# Patient Record
Sex: Female | Born: 1980 | Race: Black or African American | Hispanic: No | Marital: Married | Smoking: Never smoker
Health system: Southern US, Community
[De-identification: ages and names within clinical notes are randomized; demographics above are authoritative.]

## PROBLEM LIST (undated history)

## (undated) DIAGNOSIS — E039 Hypothyroidism, unspecified: Secondary | ICD-10-CM

## (undated) DIAGNOSIS — J45909 Unspecified asthma, uncomplicated: Secondary | ICD-10-CM

## (undated) DIAGNOSIS — Z8742 Personal history of other diseases of the female genital tract: Secondary | ICD-10-CM

## (undated) DIAGNOSIS — O09519 Supervision of elderly primigravida, unspecified trimester: Secondary | ICD-10-CM

## (undated) DIAGNOSIS — R51 Headache: Secondary | ICD-10-CM

## (undated) DIAGNOSIS — Z9889 Other specified postprocedural states: Secondary | ICD-10-CM

## (undated) DIAGNOSIS — R519 Headache, unspecified: Secondary | ICD-10-CM

## (undated) DIAGNOSIS — B009 Herpesviral infection, unspecified: Secondary | ICD-10-CM

## (undated) DIAGNOSIS — D649 Anemia, unspecified: Secondary | ICD-10-CM

## (undated) DIAGNOSIS — E063 Autoimmune thyroiditis: Secondary | ICD-10-CM

## (undated) DIAGNOSIS — R112 Nausea with vomiting, unspecified: Secondary | ICD-10-CM

## (undated) HISTORY — PX: ABDOMINAL HYSTERECTOMY: SHX81

## (undated) HISTORY — DX: Headache, unspecified: R51.9

## (undated) HISTORY — PX: WISDOM TOOTH EXTRACTION: SHX21

## (undated) HISTORY — DX: Anemia, unspecified: D64.9

## (undated) HISTORY — DX: Herpesviral infection, unspecified: B00.9

## (undated) HISTORY — DX: Autoimmune thyroiditis: E06.3

## (undated) HISTORY — DX: Personal history of other diseases of the female genital tract: Z87.42

## (undated) HISTORY — PX: DIAGNOSTIC LAPAROSCOPY: SUR761

## (undated) HISTORY — DX: Supervision of elderly primigravida, unspecified trimester: O09.519

## (undated) HISTORY — DX: Unspecified asthma, uncomplicated: J45.909

## (undated) HISTORY — PX: HERNIA REPAIR: SHX51

## (undated) HISTORY — DX: Hypothyroidism, unspecified: E03.9

## (undated) HISTORY — DX: Headache: R51

---

## 2001-04-08 ENCOUNTER — Other Ambulatory Visit: Admission: RE | Admit: 2001-04-08 | Discharge: 2001-04-08 | Payer: Self-pay | Admitting: Obstetrics and Gynecology

## 2002-06-09 ENCOUNTER — Encounter: Payer: Self-pay | Admitting: Emergency Medicine

## 2002-06-09 ENCOUNTER — Other Ambulatory Visit: Admission: RE | Admit: 2002-06-09 | Discharge: 2002-06-09 | Payer: Self-pay | Admitting: Obstetrics and Gynecology

## 2002-06-09 ENCOUNTER — Emergency Department (HOSPITAL_COMMUNITY): Admission: EM | Admit: 2002-06-09 | Discharge: 2002-06-09 | Payer: Self-pay | Admitting: Emergency Medicine

## 2002-10-18 ENCOUNTER — Other Ambulatory Visit: Admission: RE | Admit: 2002-10-18 | Discharge: 2002-10-18 | Payer: Self-pay | Admitting: Obstetrics and Gynecology

## 2002-12-09 HISTORY — PX: FINGER FRACTURE SURGERY: SHX638

## 2003-03-11 ENCOUNTER — Encounter: Payer: Self-pay | Admitting: Emergency Medicine

## 2003-03-11 ENCOUNTER — Emergency Department (HOSPITAL_COMMUNITY): Admission: EM | Admit: 2003-03-11 | Discharge: 2003-03-11 | Payer: Self-pay | Admitting: Emergency Medicine

## 2003-03-19 ENCOUNTER — Ambulatory Visit (HOSPITAL_COMMUNITY): Admission: RE | Admit: 2003-03-19 | Discharge: 2003-03-19 | Payer: Self-pay | Admitting: Orthopedic Surgery

## 2003-05-25 ENCOUNTER — Emergency Department (HOSPITAL_COMMUNITY): Admission: EM | Admit: 2003-05-25 | Discharge: 2003-05-25 | Payer: Self-pay | Admitting: Emergency Medicine

## 2003-06-01 ENCOUNTER — Emergency Department (HOSPITAL_COMMUNITY): Admission: EM | Admit: 2003-06-01 | Discharge: 2003-06-01 | Payer: Self-pay | Admitting: Emergency Medicine

## 2004-01-19 ENCOUNTER — Other Ambulatory Visit: Admission: RE | Admit: 2004-01-19 | Discharge: 2004-01-19 | Payer: Self-pay | Admitting: Obstetrics & Gynecology

## 2004-04-27 ENCOUNTER — Emergency Department (HOSPITAL_COMMUNITY): Admission: EM | Admit: 2004-04-27 | Discharge: 2004-04-28 | Payer: Self-pay | Admitting: Emergency Medicine

## 2004-08-06 ENCOUNTER — Emergency Department (HOSPITAL_COMMUNITY): Admission: EM | Admit: 2004-08-06 | Discharge: 2004-08-07 | Payer: Self-pay | Admitting: Emergency Medicine

## 2004-08-09 ENCOUNTER — Emergency Department (HOSPITAL_COMMUNITY): Admission: EM | Admit: 2004-08-09 | Discharge: 2004-08-09 | Payer: Self-pay | Admitting: Emergency Medicine

## 2005-11-27 ENCOUNTER — Emergency Department (HOSPITAL_COMMUNITY): Admission: EM | Admit: 2005-11-27 | Discharge: 2005-11-27 | Payer: Self-pay | Admitting: Emergency Medicine

## 2006-12-31 ENCOUNTER — Other Ambulatory Visit: Admission: RE | Admit: 2006-12-31 | Discharge: 2006-12-31 | Payer: Self-pay | Admitting: Internal Medicine

## 2007-01-01 ENCOUNTER — Encounter: Admission: RE | Admit: 2007-01-01 | Discharge: 2007-01-01 | Payer: Self-pay | Admitting: Internal Medicine

## 2007-12-10 HISTORY — PX: ENDOMETRIAL ABLATION: SHX621

## 2008-01-01 ENCOUNTER — Encounter: Payer: Self-pay | Admitting: Gynecology

## 2008-01-01 ENCOUNTER — Ambulatory Visit (HOSPITAL_BASED_OUTPATIENT_CLINIC_OR_DEPARTMENT_OTHER): Admission: RE | Admit: 2008-01-01 | Discharge: 2008-01-01 | Payer: Self-pay | Admitting: Gynecology

## 2008-11-15 ENCOUNTER — Ambulatory Visit (HOSPITAL_COMMUNITY): Admission: RE | Admit: 2008-11-15 | Discharge: 2008-11-16 | Payer: Self-pay | Admitting: Obstetrics & Gynecology

## 2008-12-07 ENCOUNTER — Encounter (INDEPENDENT_AMBULATORY_CARE_PROVIDER_SITE_OTHER): Payer: Self-pay | Admitting: *Deleted

## 2008-12-07 ENCOUNTER — Inpatient Hospital Stay (HOSPITAL_COMMUNITY): Admission: AD | Admit: 2008-12-07 | Discharge: 2008-12-07 | Payer: Self-pay | Admitting: Obstetrics and Gynecology

## 2009-11-09 ENCOUNTER — Emergency Department (HOSPITAL_COMMUNITY): Admission: EM | Admit: 2009-11-09 | Discharge: 2009-11-09 | Payer: Self-pay | Admitting: Emergency Medicine

## 2010-03-05 ENCOUNTER — Encounter (INDEPENDENT_AMBULATORY_CARE_PROVIDER_SITE_OTHER): Payer: Self-pay | Admitting: *Deleted

## 2010-03-09 ENCOUNTER — Encounter (INDEPENDENT_AMBULATORY_CARE_PROVIDER_SITE_OTHER): Payer: Self-pay | Admitting: *Deleted

## 2010-03-09 ENCOUNTER — Ambulatory Visit: Payer: Self-pay | Admitting: Gastroenterology

## 2010-03-09 DIAGNOSIS — K5900 Constipation, unspecified: Secondary | ICD-10-CM | POA: Insufficient documentation

## 2010-03-09 DIAGNOSIS — R195 Other fecal abnormalities: Secondary | ICD-10-CM

## 2010-03-09 DIAGNOSIS — J45909 Unspecified asthma, uncomplicated: Secondary | ICD-10-CM | POA: Insufficient documentation

## 2010-03-09 DIAGNOSIS — N809 Endometriosis, unspecified: Secondary | ICD-10-CM | POA: Insufficient documentation

## 2010-03-09 DIAGNOSIS — K625 Hemorrhage of anus and rectum: Secondary | ICD-10-CM

## 2010-03-09 DIAGNOSIS — Z888 Allergy status to other drugs, medicaments and biological substances status: Secondary | ICD-10-CM

## 2010-03-09 LAB — CONVERTED CEMR LAB
ALT: 16 units/L (ref 0–35)
Albumin: 3.6 g/dL (ref 3.5–5.2)
Bilirubin, Direct: 0.1 mg/dL (ref 0.0–0.3)
CO2: 28 meq/L (ref 19–32)
Calcium: 8.6 mg/dL (ref 8.4–10.5)
Chloride: 110 meq/L (ref 96–112)
Creatinine, Ser: 0.7 mg/dL (ref 0.4–1.2)
Ferritin: 31.5 ng/mL (ref 10.0–291.0)
Glucose, Bld: 79 mg/dL (ref 70–99)
Saturation Ratios: 4.3 % — ABNORMAL LOW (ref 20.0–50.0)
Total Protein: 7.2 g/dL (ref 6.0–8.3)
Transferrin: 216 mg/dL (ref 212.0–360.0)
Vitamin B-12: 295 pg/mL (ref 211–911)

## 2010-03-12 ENCOUNTER — Ambulatory Visit: Payer: Self-pay | Admitting: Gastroenterology

## 2010-03-12 DIAGNOSIS — K2990 Gastroduodenitis, unspecified, without bleeding: Secondary | ICD-10-CM

## 2010-03-12 DIAGNOSIS — K2970 Gastritis, unspecified, without bleeding: Secondary | ICD-10-CM | POA: Insufficient documentation

## 2010-03-13 ENCOUNTER — Telehealth: Payer: Self-pay | Admitting: Gastroenterology

## 2010-03-15 ENCOUNTER — Ambulatory Visit (HOSPITAL_COMMUNITY): Admission: RE | Admit: 2010-03-15 | Discharge: 2010-03-15 | Payer: Self-pay | Admitting: Obstetrics & Gynecology

## 2010-03-19 ENCOUNTER — Encounter: Payer: Self-pay | Admitting: Gastroenterology

## 2010-03-20 ENCOUNTER — Encounter: Payer: Self-pay | Admitting: Gastroenterology

## 2010-03-27 ENCOUNTER — Ambulatory Visit: Payer: Self-pay | Admitting: Gastroenterology

## 2010-03-27 ENCOUNTER — Telehealth: Payer: Self-pay | Admitting: Gastroenterology

## 2010-03-27 DIAGNOSIS — K5909 Other constipation: Secondary | ICD-10-CM | POA: Insufficient documentation

## 2010-04-09 ENCOUNTER — Telehealth: Payer: Self-pay | Admitting: Gastroenterology

## 2010-04-10 ENCOUNTER — Ambulatory Visit: Payer: Self-pay | Admitting: Gastroenterology

## 2010-11-20 ENCOUNTER — Telehealth: Payer: Self-pay | Admitting: Gastroenterology

## 2010-12-09 HISTORY — PX: COLONOSCOPY: SHX5424

## 2011-01-06 LAB — CONVERTED CEMR LAB
CRP, High Sensitivity: 0 (ref 0.00–5.00)
Sed Rate: 15 mm/hr (ref 0–22)

## 2011-01-08 NOTE — Letter (Signed)
Summary: Patient Notice- Colon Biospy Results  [facility]  [address] [place], [place] [address]   Phone: [phone]  Fax: [phone]        March 19, 2010 MRN: [id number]    Heather Pena [address]  [address] [place], [place]  [address]    Dear Ms. Postel,  I am pleased to inform you that the biopsies taken during your recent colonoscopy did not show any evidence of cancer upon pathologic examination.  Additional information/recommendations:  __No further action is needed at this time.  Please follow-up with      your primary care physician for your other healthcare needs.  __Please call [phone] to schedule a return visit to review      your condition.  _XX_Continue with the treatment plan as outlined on the day of your      exam.  __You should have a repeat colonoscopy examination for this problem           in _ years.  Please call us if you are having persistent problems or have questions about your condition that have not been fully answered at this time.  Sincerely,  Mardella Layman MD [facility]   This letter has been electronically signed by your physician.  Appended Document: Patient Notice- Colon Biospy Results letter mailed 04.13.11

## 2011-01-08 NOTE — Letter (Signed)
Summary: [facility] Instructions  [facility]  [address] [place], [place] [address]   Phone: [phone]  Fax: [phone]       [address]    [birth year]    MRN: [id number]        Procedure Day /Date: Monday, 03/12/10     Arrival Time: 8:00     Procedure Time: 9:00     Location of Procedure:                    _X _  [facility] (4th Floor)   PREPARATION FOR COLONOSCOPY WITH MOVIPREP   Starting today do not eat nuts, seeds, popcorn, corn, beans, peas,  salads, or any raw vegetables.  Do not take any fiber supplements (e.g. Metamucil, Citrucel, and Benefiber).  THE DAY BEFORE YOUR PROCEDURE         DATE: 03/11/10   DAY: Sunday  1.  Drink clear liquids the entire day-NO SOLID FOOD  2.  Do not drink anything colored red or purple.  Avoid juices with pulp.  No orange juice.  3.  Drink at least 64 oz. (8 glasses) of fluid/clear liquids during the day to prevent dehydration and help the prep work efficiently.  CLEAR LIQUIDS INCLUDE: Water Jello Ice Popsicles Tea (sugar ok, no milk/cream) Powdered fruit flavored drinks Coffee (sugar ok, no milk/cream) Gatorade Juice: apple, white grape, white cranberry  Lemonade Clear bullion, consomm, broth Carbonated beverages (any kind) Strained chicken noodle soup Hard Candy                             4.  In the morning, mix first dose of MoviPrep solution:    Empty 1 Pouch A and 1 Pouch B into the disposable container    Add lukewarm drinking water to the top line of the container. Mix to dissolve    Refrigerate (mixed solution should be used within 24 hrs)  5.  Begin drinking the prep at 5:00 p.m. The MoviPrep container is divided by 4 marks.   Every 15 minutes drink the solution down to the next mark (approximately 8 oz) until the full liter is complete.   6.  Follow completed prep with 16 oz of clear liquid of your choice (Nothing red or purple).  Continue to drink clear liquids until  bedtime.  7.  Before going to bed, mix second dose of MoviPrep solution:    Empty 1 Pouch A and 1 Pouch B into the disposable container    Add lukewarm drinking water to the top line of the container. Mix to dissolve    Refrigerate  THE DAY OF YOUR PROCEDURE      DATE: 03/12/10  DAY:  Monday  Beginning at 4:00 a.m. (5 hours before procedure):         1. Every 15 minutes, drink the solution down to the next mark (approx 8 oz) until the full liter is complete.  2. Follow completed prep with 16 oz. of clear liquid of your choice.    3. You may drink clear liquids until 7:00  (2 HOURS BEFORE PROCEDURE).   MEDICATION INSTRUCTIONS  Unless otherwise instructed, you should take regular prescription medications with a small sip of water   as early as possible the morning of your procedure.                  OTHER INSTRUCTIONS  You will need a  responsible adult at least 29 years of age to accompany you and drive you home.   This person must remain in the waiting room during your procedure.  Wear loose fitting clothing that is easily removed.  Leave jewelry and other valuables at home.  However, you may wish to bring a book to read or  an iPod/MP3 player to listen to music as you wait for your procedure to start.  Remove all body piercing jewelry and leave at home.  Total time from sign-in until discharge is approximately 2-3 hours.  You should go home directly after your procedure and rest.  You can resume normal activities the  day after your procedure.  The day of your procedure you should not:   Drive   Make legal decisions   Operate machinery   Drink alcohol   Return to work  You will receive specific instructions about eating, activities and medications before you leave.    The above instructions have been reviewed and explained to me by   _______________________    I fully understand and can verbalize these instructions _____________________________  Date _________

## 2011-01-08 NOTE — Miscellaneous (Signed)
Summary: clotest  Clinical Lists Changes  Problems: Added new problem of GASTRITIS (ICD-535.50) Orders: Added new Test order of TLB-H Pylori Screen Gastric Biopsy (83013-CLOTEST) - Signed 

## 2011-01-08 NOTE — Letter (Signed)
Summary: New Patient letter  [facility]  [address] [place], [place] [address]   Phone: [phone]  Fax: [phone]       03/05/2010 MRN: [id number]  Heather Pena [address] [place], [place]  [address]  Dear Heather Pena,  Welcome to the Gastroenterology Division at [facility].    You are scheduled to see Dr. Jarold Motto on 03-09-10 at 9:30a.m. on the 3rd floor at [facility], [address]  We ask that you try to arrive at our office 15 minutes prior to your appointment time to allow for check-in.  We would like you to complete the enclosed self-administered evaluation form prior to your visit and bring it with you on the day of your appointment.  We will review it with you.  Also, please bring a complete list of all your medications or, if you prefer, bring the medication bottles and we will list them.  Please bring your insurance card so that we may make a copy of it.  If your insurance requires a referral to see a specialist, please bring your referral form from your primary care physician.  Co-payments are due at the time of your visit and may be paid by cash, check or credit card.     Your office visit will consist of a consult with your physician (includes a physical exam), any laboratory testing he/she may order, scheduling of any necessary diagnostic testing (e.g. x-ray, ultrasound, CT-scan), and scheduling of a procedure (e.g. Endoscopy, Colonoscopy) if required.  Please allow enough time on your schedule to allow for any/all of these possibilities.    If you cannot keep your appointment, please call [phone] to cancel or reschedule prior to your appointment date.  This allows [place] the opportunity to schedule an appointment for another patient in need of care.  If you do not cancel or reschedule by 5 p.m. the business day prior to your appointment date, you will be charged a $50.00 late cancellation/no-show fee.    Thank you for  choosing [facility] for your medical needs.  We appreciate the opportunity to care for you.  Please visit [place] at our website  to learn more about our practice.                     Sincerely,                                                             The Gastroenterology Division

## 2011-01-08 NOTE — Procedures (Signed)
Summary: Upper Endoscopy  Patient: [name] Note: All result statuses are Final unless otherwise noted.  Tests: (1) Upper Endoscopy (EGD)   EGD Upper Endoscopy       DONE     [facility]     [address]     [place], [place]  [address]           ENDOSCOPY PROCEDURE REPORT           PATIENT:  Heather Pena, Heather Pena  MR#:  [id number]     BIRTHDATE:  [birth year], 29 yrs. old  GENDER:  female           ENDOSCOPIST:  Vania Rea. Jarold Motto, MD, [facility]     Referred by:           PROCEDURE DATE:  03/12/2010     PROCEDURE:  EGD with biopsy     ASA CLASS:  Class II     INDICATIONS:  abdominal pain, multiple sites, diarrhea, FOBT +     stool           MEDICATIONS:   There was residual sedation effect present from     prior procedure., Fentanyl 25 mcg IV, Versed 3 mg IV     TOPICAL ANESTHETIC:  Exactacain Spray           DESCRIPTION OF PROCEDURE:   After the risks benefits and     alternatives of the procedure were thoroughly explained, informed     consent was obtained.  The LB GIF-H180 [id number] endoscope was     introduced through the mouth and advanced to the second portion of     the duodenum, without limitations.  The instrument was slowly     withdrawn as the mucosa was fully examined.     <<PROCEDUREIMAGES>>           Mild gastritis was found in the antrum. CLO AND REGULAR BIOPSIES     DONE.    Retroflexed views revealed no abnormalities.    The scope     was then withdrawn from the patient and the procedure completed.           COMPLICATIONS:  None           ENDOSCOPIC IMPRESSION:     1) Mild gastritis in the antrum     ?? NSAID DAMAGE.R/O H.PYLORI.     RECOMMENDATIONS:     1) Avoid NSAIDS for two weeks     2) Await biopsy results           REPEAT EXAM:  No           ______________________________     Vania Rea. Jarold Motto, MD, [facility]           CC:           n.     eSIGNED:   Vania Rea. Patterson at 03/12/2010 09:44 AM           [name],  [id number]  Note: An exclamation mark (!) indicates a result that was not dispersed into the flowsheet. Document Creation Date: 03/12/2010 9:45 AM _______________________________________________________________________  (1) Order result status: Final Collection or observation date-time: 03/12/2010 09:35 Requested date-time:  Receipt date-time:  Reported date-time:  Referring Physician:   Ordering Physician: Sheryn Bison [phone]) Specimen Source:  Source: Launa Grill Order Number: [id number] Lab site:

## 2011-01-08 NOTE — Procedures (Signed)
Summary: Colonoscopy  Patient: Heather Pena Note: All result statuses are Final unless otherwise noted.  Tests: (1) Colonoscopy (COL)   COL Colonoscopy           DONE     [facility]     [address]     [place], [place]  [address]           COLONOSCOPY PROCEDURE REPORT           PATIENT:  [name], [name]  MR#:  [id number]     BIRTHDATE:  12-27-1980, 29 yrs. old  GENDER:  female     ENDOSCOPIST:  Vania Rea. Jarold Motto, MD, [facility]     REF. BY:     PROCEDURE DATE:  03/12/2010     PROCEDURE:  Colonoscopy with biopsy     ASA CLASS:  Class II     INDICATIONS:  FOBT positive stool, abdominal pain, unexplained     diarrhea     MEDICATIONS:   Fentanyl 75 mcg IV, Versed 7 mg IV           DESCRIPTION OF PROCEDURE:   After the risks benefits and     alternatives of the procedure were thoroughly explained, informed     consent was obtained.  Digital rectal exam was performed and     revealed no abnormalities.   The LB PCF-H180AL [id number] endoscope     was introduced through the anus and advanced to the terminal ileum     which was intubated for a short distance, without limitations.     The quality of the prep was excellent, using MoviPrep.  The     instrument was then slowly withdrawn as the colon was fully     examined.     <<PROCEDUREIMAGES>>           FINDINGS:  There were inflammatory changes in the ileum. granular     and nodular ileum biopsied.  Colitis was found in the sigmoid     colon. grauular sigmiod with erosions biopsied.  No polyps or     cancers were seen.   Retroflexed views in the rectum revealed no     abnormalities.    The scope was then withdrawn from the patient     and the procedure completed.           COMPLICATIONS:  None     ENDOSCOPIC IMPRESSION:     1) Ileitis     2) Colitis in the sigmoid colon     3) No polyps or cancers     possible CROHN'S DISEASE.     RECOMMENDATIONS:     1) Avoid all NSAIDS for the next 2 weeks.     2) Await biopsy  results     3) call office next 1-3 days to schedule followup visit in     4) Upper endoscopy will be scheduled     ENTOCORT 3MG . #100.3 PO QAM.     REPEAT EXAM:  No           ______________________________     Vania Rea. Jarold Motto, MD, [facility]           CC:           n.     eSIGNED:   Vania Rea. Tyrese Ficek at 03/12/2010 09:30 AM           [name], [id number]  Note: An exclamation mark (!) indicates a result that was not dispersed into the flowsheet. Document  Creation Date: 03/12/2010 9:31 AM _______________________________________________________________________  (1) Order result status: Final Collection or observation date-time: 03/12/2010 09:22 Requested date-time:  Receipt date-time:  Reported date-time:  Referring Physician:   Ordering Physician: Sheryn Bison [phone]) Specimen Source:  Source: Launa Grill Order Number: [id number] Lab site:   Appended Document: Colonoscopy NOTED

## 2011-01-08 NOTE — Miscellaneous (Signed)
Summary: entocort prescription  Clinical Lists Changes  Medications: Added new medication of ENTOCORT EC 3 MG  CP24 (BUDESONIDE) Take 3 capsules daily in am - Signed Rx of ENTOCORT EC 3 MG  CP24 (BUDESONIDE) Take 3 capsules daily in am;  #100 x 3;  Signed;  Entered by: Laverna Peace RN;  Authorized by: Mardella Layman MD [facility];  Method used: Electronically to [facility] # [id number]*, [address] [place], [place]  [address], Ph: [phone], Fax: [phone]    Prescriptions: ENTOCORT EC 3 MG  CP24 (BUDESONIDE) Take 3 capsules daily in am  #100 x 3   Entered by:   Laverna Peace RN   Authorized by:   Mardella Layman MD [facility]   Signed by:   Laverna Peace RN on 03/12/2010   Method used:   Electronically to        [facility] # [id number]* (retail)       [address]       [place], [place]  [address]       Ph: [phone]       Fax: [phone]   RxID:   [id number]

## 2011-01-08 NOTE — Letter (Signed)
Summary: Patient [facility] Biopsy Results  [facility]  [address] [place], [place] [address]   Phone: [phone]  Fax: [phone]        March 20, 2010 MRN: [id number]    Heather Pena [address]  [address] [place], [place]  [address]    Dear Ms. Haddox,  I am pleased to inform you that the biopsies taken during your recent endoscopic examination did not show any evidence of cancer upon pathologic examination.  Additional information/recommendations:  __No further action is needed at this time.  Please follow-up with      your primary care physician for your other healthcare needs.  __ Please call [phone] to schedule a return visit to review      your condition.  _x_ Continue with the treatment plan as outlined on the day of your      exam.  __ You should have a repeat endoscopic examination for this problem              in _ months/years.   Please call us if you are having persistent problems or have questions about your condition that have not been fully answered at this time.  Sincerely,  Mardella Layman MD [facility]  This letter has been electronically signed by your physician.  Appended Document: Patient Notice-Endo Biopsy Results letter mailed 04.13.11

## 2011-01-08 NOTE — Progress Notes (Signed)
Summary: Triage  Phone Note Call from Patient Call back at Home Phone [phone]   Caller: Patient Call For: Dr. Jarold Motto Reason for Call: Talk to Nurse Summary of Call: Entocort is too expensive. Wants to know if there is a alternative med Initial call taken by: Karna Christmas,  March 13, 2010 11:21 AM  Follow-up for Phone Call        Savings card given to pt.  She will call back if med still too expensive. Follow-up by: Ashok Cordia RN,  March 13, 2010 12:23 PM

## 2011-01-08 NOTE — Assessment & Plan Note (Signed)
Summary: F/U APPT...LSW.   History of Present Illness Visit Type: Follow-up Visit Primary GI MD: Sheryn Bison MD FACP FAGA Chief Complaint: Patient still having some left sided abdominal pain that is always there, but gets sharp at times. She did have some a small trace of blood in her stool last week but none since, she is having some constipation. She took Miralax as a 7 day treatment and the stopped, and she also states that she was told by Lupita Leash to stop the Entocort on April 20th. She states that her stool have alot of mucous in them still.  History of Present Illness:   Continued continued constipation without significant rectal bleeding or abdominal pain. She is a good response to MiraLax prescription. Labs/ inflammatory bowel disease serologies were all negative. She has stopped her Entocort therapy.   GI Review of Systems    Reports abdominal pain.     Location of  Abdominal pain: left side.    Denies acid reflux, belching, bloating, chest pain, dysphagia with liquids, dysphagia with solids, heartburn, loss of appetite, nausea, vomiting, vomiting blood, weight loss, and  weight gain.      Reports constipation and  rectal bleeding.     Denies anal fissure, black tarry stools, change in bowel habit, diarrhea, diverticulosis, fecal incontinence, heme positive stool, hemorrhoids, irritable bowel syndrome, jaundice, light color stool, liver problems, and  rectal pain.    Current Medications (verified): 1)  Albuterol Sulfate (2.5 Mg/67ml) 0.083% Nebu (Albuterol Sulfate) .... As Needed Once or Twice A Month 2)  Tramadol Hcl 50 Mg Tabs (Tramadol Hcl) .Marland Kitchen.. 1 By Mouth Q 6-8 Hrs As Needed Pain 3)  Dexilant 60 Mg Cpdr (Dexlansoprazole) .Marland Kitchen.. 1 By Mouth Qd 4)  Tandem 162-115.2 Mg Caps (Ferrous Fum-Iron Polysacch) .Marland Kitchen.. 1 By Mouth Once Daily 5)  Colace 100 Mg  Caps (Docusate Sodium) .... Two Times A Day  Allergies (verified): 1)  ! Sulfa  Past History:  Past medical, surgical, family and  social histories (including risk factors) reviewed for relevance to current acute and chronic problems.  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 Laparoscopy x 2 Finger surgery-left hand Wisdom teeth extraction  Family History: Reviewed history from 03/09/2010 and no changes required. Brain tumor: Uncle Family History of Diabetes: Maternal Grandmother No FH of Colon Cancer:  Social History: Reviewed history from 03/09/2010 and no changes required. 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  Alcohol Use - yes occasionally Daily Caffeine Use Illicit Drug Use - no Patient gets regular exercise.  Review of Systems       The patient complains of menstrual pain.  The patient denies allergy/sinus, anemia, anxiety-new, arthritis/joint pain, back pain, blood in urine, breast changes/lumps, change in vision, confusion, cough, coughing up blood, depression-new, fainting, fatigue, fever, headaches-new, hearing problems, heart murmur, heart rhythm changes, itching, muscle pains/cramps, night sweats, nosebleeds, pregnancy symptoms, shortness of breath, skin rash, sleeping problems, sore throat, swelling of feet/legs, swollen lymph glands, thirst - excessive , urination - excessive , urination changes/pain, urine leakage, vision changes, and voice change.    Vital Signs:  Patient profile:   30 year old female Height:      72  inches Weight:      115.0 pounds BMI:     20.44 Pulse  rate:   74 / minute Pulse rhythm:   regular BP sitting:   100 / 62  (left arm) Cuff size:   regular  Vitals Entered By: Harlow Mares CMA Duncan Dull) (Apr 10, 2010 10:57 AM)  Physical Exam  General:  Well developed, well nourished, no acute distress.healthy appearing.   Head:  Normocephalic and atraumatic. Eyes:  PERRLA, no icterus.exam deferred to patient's ophthalmologist.   Abdomen:  Soft, nontender and nondistended. No masses, hepatosplenomegaly or hernias noted. Normal bowel sounds. Psych:  Alert and cooperative. Normal mood and affect.   Impression & Recommendations:  Problem # 1:  OTHER CONSTIPATION (ICD-564.09) Assessment Improved Continue p.o. fiber as tolerated liberal p.o. fluids and nightly MiraLax for 8 ounces or regular basis. P.r.n. GI followup as needed. It appears that her previous cut irritation was from NSAID use associate with heavy menstrual periods. She is to stay off NSAIDs and use p.r.n. tramadol 50 mg every 6-8 hours as needed.  Problem # 2:  ENDOMETRIOSIS, SITE UNSPECIFIED (ICD-617.9) Assessment: Unchanged  Patient Instructions: 1)  Stop Enotcort. 2)  Stop Dexilant. 3)  Begin Miralax each night. 4)  The medication list was reviewed and reconciled.  All changed / newly prescribed medications were explained.  A complete medication list was provided to the patient / caregiver. 5)  Please schedule a follow-up appointment as needed.  6)  Constipation and Hemorrhoids brochure given.   Appended Document: F/U APPT...LSW.    Clinical Lists Changes  Medications: Removed medication of DEXILANT 60 MG CPDR (DEXLANSOPRAZOLE) 1 by mouth qd Added new medication of MIRALAX   POWD (POLYETHYLENE GLYCOL 3350) q hs

## 2011-01-08 NOTE — Progress Notes (Signed)
  Phone Note Outgoing Call   Call placed by: Ok Anis CMA,  Apr 09, 2010 11:37 AM Call placed to: Insurer Summary of Call: Received from [facility] Prior Authorization form for patients Dexilant. Filled form out and faxed back today. Waiting on response

## 2011-01-08 NOTE — Assessment & Plan Note (Signed)
Summary: f/u from procedure--ch.   History of Present Illness Visit Type: Follow-up Visit Primary GI MD: Sheryn Bison MD FACP FAGA Chief Complaint: Patient here for f/u after endo/colon....patient states that she is still experiencing abdominal discomfort/sharp pains as well as some very occasional loose stool. She denies any brbpr. She has had some nausea and vomiting. History of Present Illness:   She still has abdominal gas, bloating, cramping, and severe constipation. Colonoscopy showed mild ileitis and colitis and biopsies now are reviewed and most likely are secondary to prior heavy NSAID use associated with heavy menstrual periods and her endometriosis. She was taking as much as 800 mg of ibuprofen 3-4 times a day. She continues to have regular gynecologic followup. Labs were all unremarkable. She still may do a week between bowel movements but has no nausea and vomiting rather systemic complaints.   GI Review of Systems    Reports abdominal pain, belching, loss of appetite, nausea, and  vomiting.     Location of  Abdominal pain: generalized.    Denies acid reflux, bloating, chest pain, dysphagia with liquids, dysphagia with solids, heartburn, vomiting blood, weight loss, and  weight gain.      Reports change in bowel habits.     Denies anal fissure, black tarry stools, constipation, diarrhea, diverticulosis, fecal incontinence, heme positive stool, hemorrhoids, irritable bowel syndrome, jaundice, light color stool, liver problems, rectal bleeding, and  rectal pain.    Current Medications (verified): 1)  Albuterol Sulfate (2.5 Mg/82ml) 0.083% Nebu (Albuterol Sulfate) .... As Needed Once or Twice A Month 2)  Moviprep 100 Gm  Solr (Peg-Kcl-Nacl-Nasulf-Na Asc-C) .... As Per Prep Instructions. 3)  Tramadol Hcl 50 Mg Tabs (Tramadol Hcl) .Marland Kitchen.. 1 By Mouth Q 6-8 Hrs As Needed Pain 4)  Dexilant 60 Mg Cpdr (Dexlansoprazole) .Marland Kitchen.. 1 By Mouth Qd 5)  Tandem 162-115.2 Mg Caps (Ferrous Fum-Iron  Polysacch) .Marland Kitchen.. 1 By Mouth Once Daily 6)  Entocort Ec 3 Mg  Cp24 (Budesonide) .... Take 3 Capsules Daily in Am  Allergies: 1)  ! Sulfa  Past History:  Past medical, surgical, family and social histories (including risk factors) reviewed for relevance to current acute and chronic problems.  Past Medical History: Reviewed history from 03/08/2010 and no changes required. Asthma Endometriosis  Past Surgical History: Laparoscopy x 2 Finger surgery-left hand Wisdom teeth extraction  Family History: Reviewed history from 03/09/2010 and no changes required. Brain tumor: Uncle Family History of Diabetes: Maternal Grandmother No FH of Colon Cancer:  Social History: Reviewed history from 03/09/2010 and no changes required. 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  Alcohol Use - yes occasionally Daily Caffeine Use Illicit Drug Use - no Patient gets regular exercise.  Review of Systems       The patient complains of menstrual pain.  The patient denies allergy/sinus, anemia, anxiety-new, arthritis/joint pain, back pain, blood in urine, breast changes/lumps, change in vision, confusion, cough, coughing up blood, depression-new, fainting, fatigue, fever, headaches-new, hearing problems, heart murmur, heart rhythm changes, itching, muscle pains/cramps, night sweats, nosebleeds, pregnancy symptoms, shortness of breath, skin rash, sleeping problems, sore throat, swelling of feet/legs, swollen lymph glands, thirst - excessive , urination - excessive , urination changes/pain, urine leakage, vision changes, and voice change.    Vital Signs:  Patient profile:   30 year old female Height:      63 inches Weight:      114.50 pounds BMI:     20.36 BSA:     1.53 Pulse rate:  72 / minute Pulse rhythm:   regular BP sitting:   110 / 72  (left arm)  Vitals Entered By: Hortense Ramal CMA Duncan Dull) (March 27, 2010 9:51 AM)  Physical Exam  General:  Well developed, well nourished,  no acute distress.healthy appearing.  healthy appearing.   Head:  Normocephalic and atraumatic. Eyes:  PERRLA, no icterus.exam deferred to patient's ophthalmologist.  exam deferred to patient's ophthalmologist.   Abdomen:  Soft, nontender and nondistended. No masses, hepatosplenomegaly or hernias noted. Normal bowel sounds.Her abdomen is nondistended and nontender. I cannot appreciate any bowel sounds. Extremities:  No clubbing, cyanosis, edema or deformities noted. Neurologic:  Alert and  oriented x4;  grossly normal neurologically. Psych:  Alert and cooperative. Normal mood and affect.   Impression & Recommendations:  Problem # 1:  ILEITIS (ICD-558.9) Assessment Unchanged Probable NSAID damage. I doubt Crohn's disease but we'll check IBD serology from [facility] lab, CRP and sed rate. I decreased her Entocort to 6 mg a day for week, then 3 mg a day for a week, and return to clinic for visit in 2 weeks.  Problem # 2:  OTHER CONSTIPATION (ICD-564.09) Assessment: Unchanged MiraLax 8 ounces regularly at bedtime with Colace 100 mg twice a day. Orders: TLB-CRP-High Sensitivity (C-Reactive Protein) (86140-FCRP) TLB-Sedimentation Rate (ESR) (85652-ESR)  Problem # 3:  ENDOMETRIOSIS, SITE UNSPECIFIED (ICD-617.9) Assessment: Unchanged Continue contact logic followup as scheduled with possible repeat laparoscopy. Patient was iron deficient is currently on tandem therapy.  Problem # 4:  GASTRITIS (ICD-535.50) Assessment: Improved NSAID damage and gastritis with negative evaluation for Helicobacter. We'll finish a course of Zegerid therapy. Again, she is to avoid NSAIDs and use p.r.n. tramadol as needed for pain.  Patient Instructions: 1)  Please go to the basement for lab work. 2)  Decrease Entocort to 2 per day for one week then decrease to one a day. 3)  Begin Miralax at bedtime. 4)  Begin colace two times a day.  A prescription will be sent to your pharmacy. 5)  The medication list was  reviewed and reconciled.  All changed / newly prescribed medications were explained.  A complete medication list was provided to the patient / caregiver.  Appended Document: f/u from procedure--ch.    Clinical Lists Changes  Medications: Changed medication from ENTOCORT EC 3 MG  CP24 (BUDESONIDE) Take 3 capsules daily in am to ENTOCORT EC 3 MG  CP24 (BUDESONIDE) Two tabs daily for one week then once daily Added new medication of MIRALAX   POWD (POLYETHYLENE GLYCOL 3350) q hs Added new medication of COLACE 100 MG  CAPS (DOCUSATE SODIUM) two times a day - Signed Rx of COLACE 100 MG  CAPS (DOCUSATE SODIUM) two times a day;  #60 x 11;  Signed;  Entered by: Ashok Cordia RN;  Authorized by: Mardella Layman MD [facility];  Method used: Electronically to [facility] [address] # [id number]*, [address], [place], [place]  [address], Ph: [phone], Fax: [phone]    Prescriptions: COLACE 100 MG  CAPS (DOCUSATE SODIUM) two times a day  #60 x 11   Entered by:   Ashok Cordia RN   Authorized by:   Mardella Layman MD [facility]   Signed by:   Ashok Cordia RN on 03/27/2010   Method used:   Electronically to        [facility] # [phone]* (retail)       [address]       [place], [place]  [address]  Ph: [phone]       Fax: [phone]   RxID:   [id number]

## 2011-01-08 NOTE — Assessment & Plan Note (Signed)
Summary: abd/side pain--ch.   History of Present Illness Primary GI MD: Sheryn Bison MD FACP FAGA Chief Complaint: lower abd pain that radiated to left side of abdomen. Pt states she thought it was gas pains and took Gas-X without any improvement. Pt has had a change in her bowels was mucus. Pt has lately had a lot of hard stools and constipation. History of Present Illness:   Very pleasant 30 year old [place] American female self referred for evaluation of increasing constipation, lower abdominal discomfort, gas bloating, indigestion and acid reflux symptoms and intermittent mucus and blood in her stools. She has not had previous barium studies or endoscopic exams.  This patient has recurrent endometriosis and apparently has had laparoscopy on 2 occasions and is currently on close gynecologic followup. She denies using birth control pills. She is followed by [facility]. She denies rectal bleeding which coincide with her menstrual periods, but she has had intermittent bloody mucus in her stools with crampy lower abdominal discomfort. She has postprandial gas and bloating, indigestion, acid reflux, but no dysphagia or hepatobiliary complaints. She says her appetite is good her weight is stable. She does take frequent ibuprofen per her menstrual cycle and headaches. She does have mild asthmatic bronchitis. Family history noncontributory. She denies systemic complaints.   GI Review of Systems    Reports abdominal pain, bloating, nausea, and  vomiting.     Location of  Abdominal pain: left side.    Denies acid reflux, belching, chest pain, dysphagia with liquids, dysphagia with solids, heartburn, loss of appetite, vomiting blood, weight loss, and  weight gain.      Reports change in bowel habits, constipation, and  rectal bleeding.     Denies anal fissure, black tarry stools, diarrhea, diverticulosis, fecal incontinence, heme positive stool, hemorrhoids, irritable bowel syndrome, jaundice,  light color stool, liver problems, and  rectal pain. Preventive Screening-Counseling & Management  Alcohol-Tobacco     Smoking Status: never  Caffeine-Diet-Exercise     Does Patient Exercise: yes      Drug Use:  no.      Current Medications (verified): 1)  Ibuprofen 800 Mg Tabs (Ibuprofen) .... As Needed During Menstrual Cycle 2)  Albuterol Sulfate (2.5 Mg/40ml) 0.083% Nebu (Albuterol Sulfate) .... As Needed Once or Twice A Month  Allergies (verified): 1)  ! Sulfa  Past History:  Past medical, surgical, family and social histories (including risk factors) reviewed for relevance to current acute and chronic problems.  Past Medical History: Reviewed history from 03/08/2010 and no changes required. Asthma Endometriosis  Past Surgical History: Laparoscopy x 2 Finger surgery Wisdom teeth extraction  Family History: Reviewed history and no changes required. Brain tumor: Uncle Family History of Diabetes: Maternal Grandmother No FH of Colon Cancer:  Social History: Reviewed history and no changes required. 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  Alcohol Use - yes occasionally Daily Caffeine Use Illicit Drug Use - no Patient gets regular exercise. Smoking Status:  never Drug Use:  no Does Patient Exercise:  yes  Review of Systems  The patient denies allergy/sinus, anemia, anxiety-new, arthritis/joint pain, back pain, blood in urine, breast changes/lumps, change in vision, confusion, cough, coughing up blood, depression-new, fainting, fatigue, fever, headaches-new, hearing problems, heart murmur, heart rhythm changes, itching, menstrual pain, muscle pains/cramps, night sweats, nosebleeds, pregnancy symptoms, shortness of breath, skin rash, sleeping problems, sore throat, swelling of feet/legs, swollen lymph glands, thirst - excessive , urination - excessive , urination changes/pain, urine leakage, vision changes, and voice change.  Vital  Signs:  Patient profile:   30 year old female Height:      63 inches Weight:      119.50 pounds BMI:     21.24 Pulse rate:   80 / minute Pulse rhythm:   regular BP sitting:   108 / 62  (right arm) Cuff size:   regular  Vitals Entered By: Christie Nottingham CMA Duncan Dull) (March 09, 2010 9:31 AM)  Physical Exam  General:  Well developed, well nourished, no acute distress.healthy appearing.   Head:  Normocephalic and atraumatic. Eyes:  PERRLA, no icterus.exam deferred to patient's ophthalmologist.   Neck:  Supple; no masses or thyromegaly. Lungs:  Clear throughout to auscultation. Heart:  Regular rate and rhythm; no murmurs, rubs,  or bruits. Abdomen:  Soft, nontender and nondistended. No masses, hepatosplenomegaly or hernias noted. Normal bowel sounds. Rectal:  Inspection unremarkable as is digital exam without masses or tenderness or impaction. There is normal color to reddish stool present which is markedly guaiac positive. Msk:  Symmetrical with no gross deformities. Normal posture. Pulses:  Normal pulses noted. Extremities:  No clubbing, cyanosis, edema or deformities noted. Neurologic:  Alert and  oriented x4;  grossly normal neurologically. Cervical Nodes:  No significant cervical adenopathy. Inguinal Nodes:  No significant inguinal adenopathy. Psych:  Alert and cooperative. Normal mood and affect.   Impression & Recommendations:  Problem # 1:  FECAL OCCULT BLOOD (ICD-792.1) Assessment New Her clinical presentation does not seem consistent with endometriosis involving her rectosigmoid area but this certainly is a possibility as is underlying colonic polyposis. I have sent her by the lab to check appropriate laboratory parameters and we'll schedule colonoscopy as an outpatient. I suggested fiber supplements and liberal p.o. fluids in the interim. She also is to avoid NSAIDs and to use p.r.n. tramadol for pain. Orders: TLB-BMP (Basic Metabolic Panel-BMET)  (80048-METABOL) TLB-Hepatic/Liver Function Pnl (80076-HEPATIC) TLB-TSH (Thyroid Stimulating Hormone) (84443-TSH) TLB-B12, Serum-Total ONLY (16109-U04) TLB-Ferritin (82728-FER) TLB-Folic Acid (Folate) (82746-FOL) TLB-IBC Pnl (Iron/FE;Transferrin) (83550-IBC) Colon/Endo (Colon/Endo)  Problem # 2:  ABDOMINAL PAIN, EPIGASTRIC (ICD-789.06) Assessment: Unchanged Probable GERD---rule out H. pylori and NSAID damage. We have begun daily PPI treatment and avoidance of NSAIDs. CBC and liver her father has been ordered. I suspect some of her asthma is exacerbated by acid reflux. Orders: TLB-BMP (Basic Metabolic Panel-BMET) (80048-METABOL) TLB-Hepatic/Liver Function Pnl (80076-HEPATIC) TLB-TSH (Thyroid Stimulating Hormone) (84443-TSH) TLB-B12, Serum-Total ONLY (54098-J19) TLB-Ferritin (82728-FER) TLB-Folic Acid (Folate) (82746-FOL) TLB-IBC Pnl (Iron/FE;Transferrin) (83550-IBC)  Problem # 3:  ENDOMETRIOSIS, SITE UNSPECIFIED (ICD-617.9) Assessment: Improved Continued followup with [facility] as previously planned.  Problem # 4:  ADVERSE DRUG REACTION, SULFA (JYN-829.56) Assessment: Comment Only  Problem # 5:  EXTRINSIC ASTHMA, UNSPECIFIED (ICD-493.00) Assessment: Improved Reflux regime reviewed, PPI initiated, and continue p.r.n. albuterol inhaler use.  Patient Instructions: 1)  Please go to the basement for lab work.  2)  Begin Dexilant once daily. 3)  You ar scheduled for an edsoscopy and colonoscopy on Monday 03/12/10. 4)  Stop Ibuprofen.  A prescription will be called in for a pain med to replace this. 5)  The medication list was reviewed and reconciled.  All changed / newly prescribed medications were explained.  A complete medication list was provided to the patient / caregiver. 6)  Copy sent to : [facility]. 7)  Please continue current medications.  8)  Constipation and Hemorrhoids brochure given.  9)  Colonoscopy and Flexible Sigmoidoscopy brochure given.  10)  Conscious  Sedation brochure given.  11)  Upper Endoscopy brochure given.  12)  High Fiber, Low Fat  Healthy Eating Plan brochure given.  Prescriptions: DEXILANT 60 MG CPDR (DEXLANSOPRAZOLE) 1 by mouth qd  #30 x 6   Entered by:   Ashok Cordia RN   Authorized by:   Mardella Layman MD [facility]   Signed by:   Ashok Cordia RN on 03/09/2010   Method used:   Electronically to        [facility] # [id number]* (retail)       [address]       [place], [place]  [address]       Ph: [phone]       Fax: [phone]   RxID:   [id number] TRAMADOL HCL 50 MG TABS (TRAMADOL HCL) 1 by mouth q 6-8 hrs as needed pain  #40 x 2   Entered by:   Ashok Cordia RN   Authorized by:   Mardella Layman MD [facility]   Signed by:   Ashok Cordia RN on 03/09/2010   Method used:   Electronically to        [facility] [address] # [phone]* (retail)       [address]       [place], [place]  [address]       Ph: [phone]       Fax: [phone]   RxID:   [id number] MOVIPREP 100 GM  SOLR (PEG-KCL-NACL-NASULF-NA ASC-C) As per prep instructions.  #1 x 0   Entered by:   Ashok Cordia RN   Authorized by:   Mardella Layman MD [facility]   Signed by:   Ashok Cordia RN on 03/09/2010   Method used:   Electronically to        [facility] [address] # [phone]* (retail)       [address]       [place], [place]  [address]       Ph: [phone]       Fax: [phone]   RxID:   [id number]

## 2011-01-08 NOTE — Progress Notes (Signed)
Summary: Triage  Phone Note Call from Patient Call back at Home Phone [phone]   Caller: Patient Call For: Dr. Jarold Motto Reason for Call: Talk to Nurse Summary of Call: Pt has misplaced her med list and the info that was given to her this morning. Fax# [phone] Initial call taken by: Karna Christmas,  March 27, 2010 1:40 PM  Follow-up for Phone Call        Med list faxed to pt as requested. Follow-up by: Ashok Cordia RN,  March 27, 2010 1:47 PM

## 2011-01-10 NOTE — Progress Notes (Signed)
Summary: Triage  Phone Note Call from Patient Call back at Home Phone [phone]   Caller: Patient Call For: Dr. Jarold Motto Reason for Call: Talk to Nurse Summary of Call: Taking some Hydrocodone/Antbotics for a root canal and having gas pains....would like to discuss Initial call taken by: Karna Christmas,  November 20, 2010 8:17 AM  Follow-up for Phone Call        Patient  reports an increase in gas since the weekend. Patient  had a root canal last week and took Hydrocodone Saturday. She still reports having BM's but stated the gas pains kept her up all last pm. She is on Amoxicillin, but stopped the Hydrocodone Saturday Patient  advised to increased her fluids and to take GASX 30 minutes prior to meals. Dr Jarold Motto, please advise. Graciella Freer, RN 11/20/10 @ 1010 Follow-up by: Darcey Nora RN, CGRN,  November 20, 2010 10:14 AM  Additional Follow-up for Phone Call Additional follow up Details #1::        I( agree...try  align daily and as needed levsin.... Additional Follow-up by: Mardella Layman MD FACG,  November 20, 2010 12:52 PM    Additional Follow-up for Phone Call Additional follow up Details #2::    Notified patient per Dr Jarold Motto to try Align and Hyoscyamine 0.125mg   1-2 tabs sl q4hr as needed for abdominal pain. Patient to call back for further problems. Follow-up by: Graciella Freer RN,  November 20, 2010 3:05 PM  New/Updated Medications: HYOSCYAMINE SULFATE 0.125 MG SUBL (HYOSCYAMINE SULFATE) 1-2 tabs sl q4hr as needed for abdominal pain Prescriptions: HYOSCYAMINE SULFATE 0.125 MG SUBL (HYOSCYAMINE SULFATE) 1-2 tabs sl q4hr as needed for abdominal pain  #60 x 1   Entered by:   Graciella Freer RN   Authorized by:   Mardella Layman MD [facility]   Signed by:   Graciella Freer RN on 11/20/2010   Method used:   Electronically to        [facility] # [phone]* (retail)       [address]       [place], [place]  [address]       Ph: [phone]       Fax:  [phone]   RxID:   [id number]

## 2011-04-23 NOTE — Op Note (Signed)
NAME:  Heather Pena, Heather Pena NO.:  [id number]   MEDICAL RECORD NO.:  [id number]          PATIENT TYPE:  OIB   LOCATION:  1532                         FACILITY:  [facility]   PHYSICIAN:  Genia Del, M.D.DATE OF BIRTH:  Apr 13, 1981   DATE OF PROCEDURE:  11/15/2008  DATE OF DISCHARGE:                               OPERATIVE REPORT   The patient came to [facility] for surgery on November 15, 2008.   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   PROCEDURE:  Da Vinci-assisted lysis of adhesions for conservative  treatment of severe endometriosis and indigo-carmine hydrotubation.   SURGEON:  Genia Del, MD   ASSISTANT:  Lendon Colonel, MD   PROCEDURE IN DETAIL:  Under general anesthesia with endotracheal  intubation, the patient was in lithotomy position.  She was prepped with  Betadine in the abdominal suprapubic vulvar and vaginal areas, and  draped as usual.  Then, Foley was put in place in the bladder.  The  vaginal exam reveals an anteverted uterus, decreased mobility, and no  distinct mass felt.  We inserted the weighted speculum in the vagina.  The anterior lip of the cervix was grasped with a tenaculum.  Dilatation  of the cervix up to Hegar dilator #25 without difficulty.  Hysterometry  is 8 cm.  We, therefore, used a #6 RUMI with the small covering, this  was put in place easily.  We removed the weighted speculum and we go to  abdomen at that time.  The supraumbilical area was infiltrated with  Marcaine 0.25% plain, 5 mL.  We then made a 1.5-cm incision at that  level, opened the aponeurosis under direct vision with Mayo scissors,  and opened the parietal peritoneum bluntly with  a finger.  A purse-  string stitch of Vicryl 0 was put in place at the aponeurosis.  We  inserted the Hasson under direct vision and inflated the abdominopelvic  cavities with CO2 to create a pneumoperitoneum.  The camera was  inserted.  We had no adhesions with the anterior wall.  We, therefore,  removed the camera.  The ports were placed in a semicircular  configuration.  Two robotic ports on the left and one robotic port on  the right with the assistant port on the lower right.  We infiltrated  the skin with Marcaine 0.25% plain at all levels, made the incisions  with the scalpel, and inserted the trocars under direct vision.  We then  side dock the robot from the left as usual and we inserted the  instruments.  The EndoShears scissor in the right robotic arm, a  [place] in the left robotic arm, and the fenestrated grasper in the  third robotic arm.  We then go to the console and started the robotic-  assisted surgery.  The uterus was placed as anteriorly as possible.  We  visualized severe adhesions in  the pelvis involving the bladder on the  anterior side of the uterus up to the fundus.  At the back, the cul-de-  sac was obliterated.  We can still recognize both tubes at the level of  the fimbriae, which were present on both sides.  The ovaries were not  visible and the tubes were adherent to the posterior aspect of the  uterus.  We, therefore, started fine dissection of the tubes releasing  them from the posterior aspect of the uterus.  We decided not to touch  the bladder, which was confirmed to be adherent after inflating it with  saline.  We then pushed indigo carmine in the uterus through the RUMI  and visualized both tubes to be patent with free spillage of the indigo  carmine at the fimbriae on both sides.  This encouraged [place] to pursue the  dissection to restore the anatomy as much as possible.  Both tubes were  completely freed and had good motility, after further dissection,  we  opened the posterior cul-de-sac to the level of good visualization of  the sigmoid colon, as it courses on the left in the posterior cul-de-  sac.  The ovaries were not easily identifiable.  We followed the  infundibulopelvic ligaments on each sides and found them stuck in the  posterior cul-de-sac, severely adhesive.  Because of the anatomical  landmarks were not present in that area, the decision was made not to  release them further.  Both tubes being free, they were in good  relationship with the ovaries and could catch a released ovule from  either side.  Therefore, we stopped dissection at this point.  We  inserted an Interceed to prevent re-formation of those adhesions at the  level of the tubes with the posterior aspect of the uterus and the cul-  de-sac.  We had irrigated and suctioned.  Hemostasis was adequate at all  levels.  We, therefore, removed all the robotic instruments.  We  undocked the robot and go with laparoscopy to further suction the  irrigation fluid, as the patient was brought back from the deep  Trendelenburg position.  We then removed the laparoscopic instruments.  We removed all trocars under direct vision and finished with the camera  in the Hasson trocar.  We removed the CO2 as much as possible.  We  closed the purse-string stitch at the supraumbilical incision, making  sure that the bowels were not involved.  We then closed the skin with a  subcuticular suture of Vicryl 4-0 at the supraumbilical incision and at  the assistant ports.  We also closed with a subcuticular stitch of  Vicryl 4-0 at the right robotic arm incision.  The two other incisions  were closed with Dermabond only.  Dermabond was put on all incisions.  The instruments vaginally were removed and hemostasis was adequate at  that level as well.  The estimated blood loss was 25 mL.  No  complications occurred.  The patient had received ampicillin and  gentamicin IV before induction.  She  was brought to recovery room in  good and stable status.      Genia Del, M.D.  Electronically Signed     ML/MEDQ  D:  11/15/2008  T:  11/16/2008  Job:  [id number]

## 2011-04-23 NOTE — H&P (Signed)
NAME:  Heather Pena, Heather Pena NO.:  [id number]   MEDICAL RECORD NO.:  [id number]          PATIENT TYPE:  AMB   LOCATION:  [facility]                         FACILITY:  [facility]   PHYSICIAN:  Timothy P. Fontaine, M.D.DATE OF BIRTH:  1981-10-20   DATE OF ADMISSION:  01/01/2008  DATE OF DISCHARGE:                              HISTORY & PHYSICAL   CHIEF COMPLAINT:  Increasing dysmenorrhea, menorrhagia, and infertility.   HISTORY OF PRESENT ILLNESS:  The patient is a 30 year old gravida 1,  para 0, AB 1 female who presents with a history of increasing  dysmenorrhea, menorrhagia, and pelvic pain.  The patient has said that  her symptoms have progressively gotten worse over the past year.  Her  and her husband have been currently attempting pregnancy without  success.  Outpatient evaluation includes negative cervical cultures,  hemoglobin of 10.9, sonohystogram which shows a normal-appearing uterus  with no intracavitary defects, and right and left ovaries were normal.  The patient is admitted at this time for a laparoscopy due to her  increasing dysmenorrhea, menorrhagia, and chromopertubation secondary to  her desires for fertility.   PAST MEDICAL HISTORY:  Uncomplicated.   PAST SURGICAL HISTORY:  Includes wisdom teeth and finger surgery.   ALLERGIES:  SULFA DRUGS.   CURRENT MEDICATIONS:  Vitamins.   REVIEW OF SYSTEMS:  Noncontributory.   FAMILY HISTORY:  Noncontributory.   SOCIAL HISTORY:  Noncontributory.   PHYSICAL EXAMINATION:  VITAL SIGNS:  Afebrile, vital signs are stable.  HEENT: Normal.  LUNGS:  Clear.  CARDIAC:  Regular rate.  No rubs, murmurs, or gallops.  ABDOMEN:  Benign.  PELVIC:  External BUS and vagina normal.  Cervix normal.  Uterus normal  size, midline mobile, and nontender.  Adnexa without masses or  tenderness.   ASSESSMENT/PLAN:  The patient is a 30 year old gravida 1, para 0, AB 1  female with increasing menorrhagia, dysmenorrhea, and normal  ultrasound  for a diagnostic laparoscopy and chromopertubation.  The proposed  surgery was reviewed with the patient to include the expected  intraoperative postoperative courses, instrumentation, trocar placement,  multiple port sites, insufflation, use of sharp and blunt dissection,  electrocautery laser, and Harmonic scalpel.  No guarantees as far as  pain relief or bleeding were made.  She understands that she may  continue to bleed heavily with pain and following the procedure this may  change or worsen.  Also, no guarantees as far as fertility following the  procedure were made and she may continue with issues of infertility  following the surgery.  Various pathological scenarios were reviewed to  include no pathology found, endometriosis, pelvic adhesions, or other  pelvic pathology.  We may address all pathology found or may leave her  with the remaining pathology depending on the location and severity, all  of which she understands and accepts.  The acute risks of surgery,  either immediately recognized or delay recognized including inadvertent  injury to internal organs including bowel, bladder, ureters, vessels and  nerves necessitating major exploratory reparative surgeries, future  reparative surgeries, bowel resection, and ostomy formation was all  discussed, understood, and  accepted.  The risks of infection requiring  prolonged antibiotics, abscess formation, reoperation, abscess drainage,  incisional complications to include opening and draining of incisions,  closure by secondary intention, and long-term issues of hernia formation  was all discussed, understood and, accepted.  The risks of hemorrhage  necessitating transfusion and the risks of transfusion including  transfusion reaction, hepatitis, HIV, Mad cow disease, and other unknown  entities was all reviewed, understood, and accepted.  The patient's  questions were answered to her satisfaction.  She is ready to  proceed  with surgery.      Timothy P. Fontaine, M.D.  Electronically Signed     TPF/MEDQ  D:  12/30/2007  T:  12/30/2007  Job:  [id number]

## 2011-04-23 NOTE — Op Note (Signed)
NAME:  Heather Pena, Heather Pena NO.:  [id number]   MEDICAL RECORD NO.:  [id number]          PATIENT TYPE:  AMB   LOCATION:  [facility]                         FACILITY:  [facility]   PHYSICIAN:  Timothy P. Fontaine, M.D.DATE OF BIRTH:  [birth year]   DATE OF PROCEDURE:  01/01/2008  DATE OF DISCHARGE:                               OPERATIVE REPORT   SURGERY LOCATION:  [facility].   PREOPERATIVE DIAGNOSES:  1. Dysmenorrhea.  2. Menorrhagia.  3. Pelvic pain.   POSTOPERATIVE DIAGNOSES:  Stage IV endometriosis.   PROCEDURE:  1. Laparoscopic lysis of adhesions.  2. Biopsy fulguration of endometriosis   SURGEON:  Dr. Audie Box   ANESTHESIA:  General.   COMPLICATIONS:  None.   ESTIMATED BLOOD LOSS:  Minimal.   SPECIMEN:  1. Cyst fluid.  2. Omental biopsy.  3. Peritoneal biopsy.   FINDINGS:  Anterior cul-de-sac obliterated where anterior uterus,  anterior peritoneum and omentum are adhesed in a wall of adhesive mass  with significant scarring and puckering to the anterior abdominal wall.  Multiple areas of shaggy, papillary-type endometriosis surrounding the  adhesive mass.  Two classic powder burn-deep lesions on lateral anterior  peritoneal surface.  Representative biopsies of both these shaggy, red  peritoneal areas as well as the deeper powder burn lesions were taken.  The omental wall obscured visualization of the uterus and the cul-de-  sac.  This was lysed with the harmonic scalpel, which allowed subsequent  visualization.  Posterior cul-de-sac was obliterated with limited  visualization.  The right fallopian tube was normal length and caliber  with fimbriated end, with positive fill and spill.  The left fallopian  tube could not be traced from its proximal segment, but a tuft of distal  portion was visualized with normal-appearing fimbria and no fill or  spill.  The uterus was not clearly visualized other than the fundal  portion.  There was a cystic  mass effect along the posterior wall which  was felt to be either side of the left ovary, possibly right coming  across the posterior uterus, versus a possible degenerating fibroid.  This cystic area was overlying hypervascular on the serosa and had a  clear plane separating it from the sigmoid which was seen to dive into  the posterior cul-de-sac.  This area was subsequently needle-aspirated  with clear-yellow fluid and opened slightly and was filled with necrotic-  type hemorrhagic debris, consistent with a probable ovarian hemorrhagic  cyst, although cannot rule out a degenerated leiomyoma.  The right and  left ovaries were not clearly visualized on either side due to the  adhesions.  An upper abdominal exam was grossly normal.  There was no  upper abdominal adhesive disease.  The appendix was not visualized.  The  liver was smooth.  The gallbladder was normal to limited inspection   PROCEDURE IN DETAIL:  The patient was taken to the operating room ,  underwent general anesthesia, was placed in a low dorsal lithotomy  position, and received an abdominal perineal vaginal preparation with  Betadine solution.  An EUA was performed and an indwelling Foley  catheter placed in sterile technique.  The cervix was visualized,  grasped with single-tooth tenaculum and a Cohen cannula was placed and  secured for subsequent dye study.  The patient was draped in the usual  fashion.  A transverse infraumbilical incision was made.  The 10-mm  OptiView direct-entry laparoscope was placed under direct visualization  without difficulty and the abdomen was subsequently insufflated.  A left-  of-midline, suprapubic 5-mm port was then placed under direct  visualization after transillumination of the vessels without difficulty  and initial examination of the pelvis was carried out.  A right midline  5-mm port was then placed under direct visualization after  transillumination for the vessels to assist in the  subsequent  dissection, and using the harmonic scalpel, the omental veil was lysed  without difficulty to allow better visualization.  The distal portion  that was adherent to the anterior abdominal wall was sent to pathology.  A dye study was then performed.  There was positive fill and spill from  the right fallopian tube and the distal portion left fallopian tube did  not fill or spill.  The cystic area was palpated across the posterior  uterine wall due to the possibilities of an endometrioma.  It was felt  that this area needed to be addressed, and after clearly visualizing the  sigmoid away from the operative field, an 18-gauge needle was placed  within the cavity and clear-yellow fluid was extracted.  Subsequently,  using the harmonic scalpel, the cystic area was incised and a cystic  cavity was encountered with hemorrhagic-type debris, felt to be probably  a physiologic hemorrhagic cyst as it was very fragile from an oozing  standpoint and was subsequently left undisturbed.  The area overlying  the capsular region was bipolar cauterized to achieve ultimate  hemostasis.  Representative biopsies of the red shaggy-type  endometriosis anteriorly were taken as well as a biopsy of the powder  burn anterior peritoneal implants, and subsequently all of the  visualized red shaggy areas were bipolar cauterized as well as the two  left of midline anterior peritoneal powder burn implants.  At this  point, it was felt no further dissection was prudent given the intensity  of the adhesive disease.  The pelvis was copiously irrigated.  Adequate  hemostasis was visualized.  The right and left suprapubic ports were  removed and the gas slowly allowed to escape.  All the surgical sites  and port sites were reinspected under a low-pressure situation, showing  adequate hemostasis, and the infraumbilical port was backed out under  direct visualization, showing adequate hemostasis and no evidence of   hernia formation.  The infraumbilical port was closed using 0-Vicryl  suture and an interrupted subcutaneous fascial stitch, and all skin  incisions were closed using Dermabond skin adhesive.  The tenaculum and  Cohen cannula were removed from the cervix, and the patient placed in  the supine position, awakened without difficulty and taken to the  recovery room in good condition, having tolerated the procedure well.      Timothy P. Fontaine, M.D.  Electronically Signed     TPF/MEDQ  D:  01/01/2008  T:  01/01/2008  Job:  [id number]

## 2011-04-26 NOTE — Op Note (Signed)
Heather Pena, Heather Pena NO.:  [id number]   MEDICAL RECORD NO.:  [id number]                   PATIENT TYPE:  OIB   LOCATION:  2852                                 FACILITY:  [facility]   PHYSICIAN:  Dionne Ano. Everlene Other, M.D.         DATE OF BIRTH:  1981-09-17   DATE OF PROCEDURE:  03/19/2003  DATE OF DISCHARGE:                                 OPERATIVE REPORT   PREOPERATIVE DIAGNOSIS:  Displaced middle phalanx fracture of the left  middle finger.   POSTOPERATIVE DIAGNOSIS:  Displaced middle phalanx fracture of the left  middle finger.   PROCEDURE:  1. Closed reduction and pinning with 3, 0.35 Kirschner wires, left middle     finger P2 (middle phalanx) fracture.  2. Stress radiography.   SURGEON:  Dionne Ano. Amanda Pea, M.D.   ASSISTANT:  None.   COMPLICATIONS:  None.   ANESTHESIA:  General.   TOURNIQUET TIME:  None.   ESTIMATED BLOOD LOSS:  Minimal to none.   INDICATIONS FOR PROCEDURE:  This patient is a very pleasant 30 year old  female who presents with the above mentioned diagnosis. I have counseled her  in regards to the risks and benefits of the surgery including the risks of  infection, bleeding, anesthesia, damage to nerves__________. The patient  understands and agrees to proceed. All questions have been encouraged and  answered preoperatively.   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 She understands the risks and benefits  and desires to proceed.   DESCRIPTION OF PROCEDURE:  The patient was seen by myself and anesthesia.  She was taken to the operating suite and after the satisfactory induction of  general anesthesia she was given preoperative Ancef and then was laid  supine. She was appropriately padded, prepped and draped in the usual  sterile fashion. Betadine scrub was painted about the left upper extremity.   Once this was done the left upper extremity was  evaluated. The finger splay  in relation to the scaphoid tubercle was abnormal about the middle finger  with significant ulnar deviatory tendency. This was due to the malrotation.   With this noted I then performed a manipulative reduction of the finger and  was able to correct the malrotation nicely in the AP and lateral and oblique  planes. I then held this with a clamp and placed 3, 0.35 K-wires across the  fracture site nicely to secure it.   I then checked this under fluoroscopy and performed stress radiography to  ensure excellent position. This was noted to be the case and was pleased and  satisfied with the reduction and the finger splay which I checked multiple  times. All fingers lined up well at the scaphoid tuberosity and looked quite  well. This was noted visually per objective examination and on the  fluoroscopy machine. Following this the patient was given a Marcaine block,  4 cc into the flexor sheath of Marcaine without epinephrine for  postoperative analgesia.   She was then awakened from anesthesia after the splint was applied. She had  excellent refill in her fingers and no signs of compartment syndrome and was  stable.   She will be discharged to home. She will return to the office to see me in  10 days and receive therapy 1 hour after my appointment for splint  application. I have discussed the dos and don'ts etc., and all questions  have been encouraged and answered. She was discharged on Vicodin,  Robaxin  and Keflex.                                               Dionne Ano. Everlene Other, M.D.    Nash Mantis  D:  03/19/2003  T:  03/19/2003  Job:  [id number]

## 2011-09-13 LAB — COMPREHENSIVE METABOLIC PANEL
ALT: 15 U/L (ref 0–35)
AST: 19 U/L (ref 0–37)
Albumin: 4.1 g/dL (ref 3.5–5.2)
Alkaline Phosphatase: 34 U/L — ABNORMAL LOW (ref 39–117)
BUN: 10 mg/dL (ref 6–23)
CO2: 28 mEq/L (ref 19–32)
Calcium: 9.6 mg/dL (ref 8.4–10.5)
Chloride: 103 mEq/L (ref 96–112)
Creatinine, Ser: 0.5 mg/dL (ref 0.4–1.2)
GFR calc Af Amer: >60 mL/min (ref >60)
GFR calc non Af Amer: >60 mL/min (ref >60)
Glucose, Bld: 95 mg/dL (ref 70–99)
Potassium: 3.9 mEq/L (ref 3.5–5.1)
Sodium: 138 mEq/L (ref 135–145)
Total Bilirubin: 0.6 mg/dL (ref 0.3–1.2)
Total Protein: 7.9 g/dL (ref 6.0–8.3)

## 2011-09-13 LAB — CBC
HCT: 34.4 % — ABNORMAL LOW (ref 36.0–46.0)
Hemoglobin: 11.4 g/dL — ABNORMAL LOW (ref 12.0–15.0)
MCHC: 32.9 g/dL (ref 30.0–36.0)
MCHC: 33.1 g/dL (ref 30.0–36.0)
MCV: 87.8 fL (ref 78.0–100.0)
MCV: 87.8 fL (ref 78.0–100.0)
Platelets: 247 10*3/uL (ref 150–400)
Platelets: 292 10*3/uL (ref 150–400)
RBC: 3.92 MIL/uL (ref 3.87–5.11)
RDW: 12.7 % (ref 11.5–15.5)
RDW: 12.9 % (ref 11.5–15.5)
WBC: 4 10*3/uL (ref 4.0–10.5)

## 2011-09-13 LAB — TYPE AND SCREEN
ABO/RH(D): B POS
Antibody Screen: NEGATIVE

## 2011-09-13 LAB — PREGNANCY, URINE: Preg Test, Ur: NEGATIVE

## 2011-09-13 LAB — ABO/RH: ABO/RH(D): B POS

## 2014-05-06 ENCOUNTER — Ambulatory Visit (INDEPENDENT_AMBULATORY_CARE_PROVIDER_SITE_OTHER): Payer: 59 | Admitting: Surgery

## 2014-05-16 ENCOUNTER — Ambulatory Visit (INDEPENDENT_AMBULATORY_CARE_PROVIDER_SITE_OTHER): Payer: 59 | Admitting: Surgery

## 2014-05-16 ENCOUNTER — Encounter (INDEPENDENT_AMBULATORY_CARE_PROVIDER_SITE_OTHER): Payer: Self-pay | Admitting: Surgery

## 2014-05-16 VITALS — BP 100/60 | HR 88 | Temp 98.4°F | Resp 14 | Ht 63.0 in | Wt 120.8 lb

## 2014-05-16 DIAGNOSIS — R1909 Other intra-abdominal and pelvic swelling, mass and lump: Secondary | ICD-10-CM

## 2014-05-16 NOTE — Patient Instructions (Signed)
Please consider the recommendations that we have given you today:  Consider surgery to explore the abdomen and remove the mass at the bellybutton.  Probable endometrioma.  Possible incarcerated hernia at bellybutton.  See the Handout(s) we have given you.  Please call our office at [phone] if you wish to schedule surgery or if you have further questions / concerns.   LAPAROSCOPIC SURGERY: POST OP INSTRUCTIONS  1. DIET: Follow a light bland diet the first 24 hours after arrival home, such as soup, liquids, crackers, etc.  Be sure to include lots of fluids daily.  Avoid fast food or heavy meals as your are more likely to get nauseated.  Eat a low fat the next few days after surgery.   2. Take your usually prescribed home medications unless otherwise directed. 3. PAIN CONTROL: a. Pain is best controlled by a usual combination of three different methods TOGETHER: i. Ice/Heat ii. Over the counter pain medication iii. Prescription pain medication b. Most patients will experience some swelling and bruising around the incisions.  Ice packs or heating pads (30-60 minutes up to 6 times a day) will help. Use ice for the first few days to help decrease swelling and bruising, then switch to heat to help relax tight/sore spots and speed recovery.  Some people prefer to use ice alone, heat alone, alternating between ice & heat.  Experiment to what works for you.  Swelling and bruising can take several weeks to resolve.   c. It is helpful to take an over-the-counter pain medication regularly for the first few weeks.  Choose one of the following that works best for you: i. Naproxen (Aleve, etc)  Two 220mg  tabs twice a day ii. Ibuprofen (Advil, etc) Three 200mg  tabs four times a day (every meal & bedtime) iii. Acetaminophen (Tylenol, etc) 500-650mg  four times a day (every meal & bedtime) d. A  prescription for pain medication (such as oxycodone, hydrocodone, etc) should be given to you upon discharge.   Take your pain medication as prescribed.  i. If you are having problems/concerns with the prescription medicine (does not control pain, nausea, vomiting, rash, itching, etc), please call us [phone] to see if we need to switch you to a different pain medicine that will work better for you and/or control your side effect better. ii. If you need a refill on your pain medication, please contact your pharmacy.  They will contact our office to request authorization. Prescriptions will not be filled after 5 pm or on week-ends. 4. Avoid getting constipated.  Between the surgery and the pain medications, it is common to experience some constipation.  Increasing fluid intake and taking a fiber supplement (such as Metamucil, Citrucel, FiberCon, MiraLax, etc) 1-2 times a day regularly will usually help prevent this problem from occurring.  A mild laxative (prune juice, Milk of Magnesia, MiraLax, etc) should be taken according to package directions if there are no bowel movements after 48 hours.   5. Watch out for diarrhea.  If you have many loose bowel movements, simplify your diet to bland foods & liquids for a few days.  Stop any stool softeners and decrease your fiber supplement.  Switching to mild anti-diarrheal medications (Kayopectate, Pepto Bismol) can help.  If this worsens or does not improve, please call us. 6. Wash / shower every day.  You may shower over the dressings as they are waterproof.  Continue to shower over incision(s) after the dressing is off. 7. Remove your waterproof bandages 5 days  after surgery.  You may leave the incision open to air.  You may replace a dressing/Band-Aid to cover the incision for comfort if you wish.  8. ACTIVITIES as tolerated:   a. You may resume regular (light) daily activities beginning the next day-such as daily self-care, walking, climbing stairs-gradually increasing activities as tolerated.  If you can walk 30 minutes without difficulty, it is safe to try more  intense activity such as jogging, treadmill, bicycling, low-impact aerobics, swimming, etc. b. Save the most intensive and strenuous activity for last such as sit-ups, heavy lifting, contact sports, etc  Refrain from any heavy lifting or straining until you are off narcotics for pain control.   c. DO NOT PUSH THROUGH PAIN.  Let pain be your guide: If it hurts to do something, don't do it.  Pain is your body warning you to avoid that activity for another week until the pain goes down. d. You may drive when you are no longer taking prescription pain medication, you can comfortably wear a seatbelt, and you can safely maneuver your car and apply brakes. e. Bonita QuinYou may have sexual intercourse when it is comfortable.  9. FOLLOW UP in our office a. Please call [facility] at [phone] to set up an appointment to see your surgeon in the office for a follow-up appointment approximately 2-3 weeks after your surgery. b. Make sure that you call for this appointment the day you arrive home to insure a convenient appointment time. 10. IF YOU HAVE DISABILITY OR FAMILY LEAVE FORMS, BRING THEM TO THE OFFICE FOR PROCESSING.  DO NOT GIVE THEM TO YOUR DOCTOR.   WHEN TO CALL US [phone]: 1. Poor pain control 2. Reactions / problems with new medications (rash/itching, nausea, etc)  3. Fever over 101.5 F (38.5 C) 4. Inability to urinate 5. Nausea and/or vomiting 6. Worsening swelling or bruising 7. Continued bleeding from incision. 8. Increased pain, redness, or drainage from the incision   The clinic staff is available to answer your questions during regular business hours (8:30am-5pm).  Please don't hesitate to call and ask to speak to one of our nurses for clinical concerns.   If you have a medical emergency, go to the nearest emergency room or call 911.  [facility] from [facility] is always on call at the [facility]   [facility], [place] [address], [address], [place], [place]   [id number] ? MAIN: [phone] ? TOLL FREE: [phone] ?  FAX [phone] [email or web address]  Endometriosis Endometriosis is a condition in which the tissue that lines the uterus (endometrium) grows outside of its normal location. The tissue may grow in many locations close to the uterus, but it commonly grows on the ovaries, fallopian tubes, vagina, or bowel. Because the uterus expels, or sheds, its lining every menstrual cycle, there is bleeding wherever the endometrial tissue is located. This can cause pain because blood is irritating to tissues not normally exposed to it.  CAUSES  The cause of endometriosis is not known.  SIGNS AND SYMPTOMS  Often, there are no symptoms. When symptoms are present, they can vary with the location of the displaced tissue. Various symptoms can occur at different times. Although symptoms occur mainly during a woman's menstrual period, they can also occur midcycle and usually stop with menopause. Some people may go months with no symptoms at all. Symptoms may include:   Back or abdominal pain.   Heavier bleeding during periods.   Pain during intercourse.  Painful bowel movements.   Infertility. DIAGNOSIS  Your health care provider will do a physical exam and ask about your symptoms. Various tests may be done, such as:   Blood tests and urine tests. These are done to help rule out other problems.   Ultrasound. This test is done to look for abnormal tissue.   An X-ray of the lower bowel (barium enema).  Laparoscopy. In this procedure, a thin, lighted tube with a tiny camera on the end (laparoscope) is inserted into your abdomen. This helps your health care provider look for abnormal tissue to confirm the diagnosis. The health care provider may also remove a small piece of tissue (biopsy) from any abnormal tissue found. This tissue sample can then be sent to a lab so it can be looked at under a microscope. TREATMENT  Treatment  will vary and may include:   Medicines to relieve pain. Nonsteroidal anti-inflammatory drugs (NSAIDs) are a type of pain medicine that can help to relieve the pain caused by endometriosis.  Hormonal therapy. When using hormonal therapy, periods are eliminated. This eliminates the monthly exposure to blood by the displaced endometrial tissue.   Surgery. Surgery may sometimes be done to remove the abnormal endometrial tissue. In severe cases, surgery may be done to remove the fallopian tubes, uterus, and ovaries (hysterectomy). HOME CARE INSTRUCTIONS   Only take over-the-counter or prescription medicines for pain, discomfort, or fever as directed by your health care provider. Do not take aspirin because it may increase bleeding when you are not on hormonal therapy.   Avoid activities that produce pain, including sexual activity. SEEK MEDICAL CARE IF:  You have pelvic pain before, after, or during your periods.  You have pelvic pain between periods that gets worse during your period.  You have pelvic pain during or after sex.  You have pelvic pain with bowel movements or urination, especially during your period.  You have problems getting pregnant. SEEK IMMEDIATE MEDICAL CARE IF:   Your pain is severe and is not responding to pain medicine.   You have severe nausea and vomiting, or you cannot keep foods down.   You have pain that is limited to the right lower part of your abdomen.   You have swelling or increasing pain in your abdomen.   You see blood in your stool.   You have a fever or persistent symptoms for more than 2 3 days.   You have a fever and your symptoms suddenly get worse. MAKE SURE YOU:   Understand these instructions.  Will watch your condition.  Will get help right away if you are not doing well or get worse. Document Released: 11/22/2000 Document Revised: 09/15/2013 Document Reviewed: 07/23/2013 [facility] Patient Information 2014 [place],  [place].

## 2014-05-16 NOTE — Progress Notes (Signed)
Subjective:     Patient ID: Heather Pena, female   DOB: 1981-11-12, 33 y.o.   MRN: [id number]  HPI  Note: Portions of this report may have been transcribed using voice recognition software. Every effort was made to ensure accuracy; however, inadvertent computerized transcription errors may be present.   Any transcriptional errors that result from this process are unintentional.            Heather Pena  [birth year] [id number]  Patient Care Team: Cain Saupe, MD as PCP - General (Family Medicine) Purcell Nails, MD as Consulting Physician (Obstetrics and Gynecology)  This patient is a 33 y.o.female who presents today for surgical evaluation at the request of Dr. Su Hilt.   Reason for visit: Painful umbilical mass.  Endometrioma vs. hernia  Pleasant young female.  History of endometriosis status post robotic ablation 2009.  She has noticed some pain near her belly button for several months.  Noticed a mass.  It has become darker in color.  There was concern about possible endometrioma.  Sent to gynecology.  They were concerned about incarcerated hernia.  Therefore surgical consultation requested.  The patient does note the mass gets more sensitive around her menstrual period.  Goes from underlying to very uncomfortable.  Using ibuprofen.  Normally has bowel movements twice a week.  Can walk several miles without difficulty.  No history of skin infections.  No surgery since 2009.  No personal nor family history of GI/colon cancer, inflammatory bowel disease, irritable bowel syndrome, allergy such as Celiac Sprue, dietary/dairy problems, colitis, ulcers nor gastritis.  No recent sick contacts/gastroenteritis.  No travel outside the country.  No changes in diet.  No dysphagia to solids or liquids.  No significant heartburn or reflux.  No hematochezia, hematemesis, coffee ground emesis.  No evidence of prior gastric/peptic ulceration.    Patient Active Problem List   Diagnosis Date  Noted  . OTHER CONSTIPATION 03/27/2010  . GASTRITIS 03/12/2010  . EXTRINSIC ASTHMA, UNSPECIFIED 03/09/2010  . CONSTIPATION 03/09/2010  . RECTAL BLEEDING 03/09/2010  . ENDOMETRIOSIS, SITE UNSPECIFIED 03/09/2010  . FECAL OCCULT BLOOD 03/09/2010  . ADVERSE DRUG REACTION, SULFA 03/09/2010    Past Medical History  Diagnosis Date  . Asthma     Past Surgical History  Procedure Laterality Date  . Colonoscopy  2012  . Endometrial ablation  2009    Dr Seymour Bars: Da Vinci-assisted lysis of adhesions for conservative  . Finger fracture surgery  2004    History   Social History  . Marital Status: Married    Spouse Name: N/A    Number of Children: N/A  . Years of Education: N/A   Occupational History  . Not on file.   Social History Main Topics  . Smoking status: Never Smoker   . Smokeless tobacco: Not on file  . Alcohol Use: No  . Drug Use: No  . Sexual Activity: Not on file   Other Topics Concern  . Not on file   Social History Narrative  . No narrative on file    History reviewed. No pertinent family history.  Current Outpatient Prescriptions  Medication Sig Dispense Refill  . ibuprofen (ADVIL,MOTRIN) 800 MG tablet       . tranexamic acid (LYSTEDA) 650 MG TABS tablet        No current facility-administered medications for this visit.     Allergies  Allergen Reactions  . Sulfonamide Derivatives     REACTION: hives    BP 100/60  Pulse  88  Temp(Src) 98.4 F (36.9 C)  Resp 14  Ht 5\' 3"  (1.6 m)  Wt 120 lb 12.8 oz (54.795 kg)  BMI 21.40 kg/m2  No results found.   Review of Systems  Constitutional: Negative for fever, chills, diaphoresis, appetite change and fatigue.  HENT: Negative for ear discharge, ear pain, sore throat and trouble swallowing.   Eyes: Negative for photophobia, discharge and visual disturbance.  Respiratory: Negative for cough, choking, chest tightness and shortness of breath.   Cardiovascular: Negative for chest pain and palpitations.    Gastrointestinal: Positive for abdominal pain. Negative for nausea, vomiting, diarrhea, constipation, blood in stool, anal bleeding and rectal pain.  Endocrine: Negative for cold intolerance and heat intolerance.  Genitourinary: Negative for dysuria, frequency and difficulty urinating.  Musculoskeletal: Negative for gait problem, myalgias and neck pain.  Skin: Negative for color change, pallor and rash.  Allergic/Immunologic: Negative for environmental allergies, food allergies and immunocompromised state.  Neurological: Negative for dizziness, speech difficulty, weakness and numbness.  Hematological: Negative for adenopathy.  Psychiatric/Behavioral: Negative for confusion and agitation. The patient is not nervous/anxious.        Objective:   Physical Exam  Constitutional: She is oriented to person, place, and time. She appears well-developed and well-nourished. No distress.  HENT:  Head: Normocephalic.  Mouth/Throat: Oropharynx is clear and moist. No oropharyngeal exudate.  Eyes: Conjunctivae and EOM are normal. Pupils are equal, round, and reactive to light. No scleral icterus.  Neck: Normal range of motion. Neck supple. No tracheal deviation present.  Cardiovascular: Normal rate, regular rhythm and intact distal pulses.   Pulmonary/Chest: Effort normal and breath sounds normal. No stridor. No respiratory distress. She exhibits no tenderness.  Abdominal: Soft. She exhibits no distension and no mass. There is no tenderness. Hernia confirmed negative in the right inguinal area and confirmed negative in the left inguinal area.    Genitourinary: No vaginal discharge found.  Musculoskeletal: Normal range of motion. She exhibits no tenderness.       Right elbow: She exhibits normal range of motion.       Left elbow: She exhibits normal range of motion.       Right wrist: She exhibits normal range of motion.       Left wrist: She exhibits normal range of motion.       Right hand: Normal  strength noted.       Left hand: Normal strength noted.  Lymphadenopathy:       Head (right side): No posterior auricular adenopathy present.       Head (left side): No posterior auricular adenopathy present.    She has no cervical adenopathy.    She has no axillary adenopathy.       Right: No inguinal adenopathy present.       Left: No inguinal adenopathy present.  Neurological: She is alert and oriented to person, place, and time. No cranial nerve deficit. She exhibits normal muscle tone. Coordination normal.  Skin: Skin is warm and dry. No rash noted. She is not diaphoretic. No erythema.  Psychiatric: She has a normal mood and affect. Her behavior is normal. Judgment and thought content normal.       Assessment:     Fixed umbilical mass.  Endometrioma vs. Incarcerated umbilical hernia.     Plan:     Think she would benefit from removal of this mass.  Probable diagnostic laparoscopy to rule out incarcerated mass.  If negative, plan excision of probable endometrioma.  No  evidence of hypertrophic or keloid scarring anywhere else argues against this being a keloid.  No problems at earlobe or supraumbilical piercings.  She wishes to be aggressive and have it removed:  The anatomy & physiology of the abdominal wall was discussed.  The pathophysiology of hernias was discussed.  Natural history risks without surgery including progeressive enlargement, pain, incarceration & strangulation was discussed.  Differential diagnosis discussed. Contributors to complications such as smoking, obesity, diabetes, prior surgery, etc were discussed.   I feel the risks of no intervention will lead to serious problems that outweigh the operative risks; therefore, I recommended surgery to reduce and repair the hernia.  I explained laparoscopic techniques with possible need for an open approach.  I noted the probable use of mesh to patch and/or buttress the hernia repair  Risks such as bleeding, infection,  abscess, need for further treatment, heart attack, death, and other risks were discussed.  I noted a good likelihood this will help address the problem.   Goals of post-operative recovery were discussed as well.  Possibility that this will not correct all symptoms was explained.  I stressed the importance of low-impact activity, aggressive pain control, avoiding constipation, & not pushing through pain to minimize risk of post-operative chronic pain or injury. Possibility of reherniation especially with smoking, obesity, diabetes, immunosuppression, and other health conditions was discussed.  We will work to minimize complications.     An educational handout further explaining the pathology & treatment options was given as well.  Questions were answered.  The patient expresses understanding & wishes to proceed with surgery.

## 2014-07-26 ENCOUNTER — Other Ambulatory Visit: Payer: Self-pay | Admitting: Obstetrics and Gynecology

## 2014-07-26 DIAGNOSIS — Z3183 Encounter for assisted reproductive fertility procedure cycle: Secondary | ICD-10-CM

## 2014-08-01 ENCOUNTER — Ambulatory Visit (HOSPITAL_COMMUNITY)
Admission: RE | Admit: 2014-08-01 | Discharge: 2014-08-01 | Disposition: A | Discharge status: Home / Self Care | Payer: 59 | Source: Ambulatory Visit | Attending: Obstetrics and Gynecology | Admitting: Obstetrics and Gynecology

## 2014-08-01 ENCOUNTER — Other Ambulatory Visit: Payer: Self-pay | Admitting: Obstetrics and Gynecology

## 2014-08-01 DIAGNOSIS — Z3183 Encounter for assisted reproductive fertility procedure cycle: Secondary | ICD-10-CM

## 2014-08-01 MED ORDER — IOHEXOL 300 MG/ML  SOLN
20.0000 mL | Freq: Once | INTRAMUSCULAR | Status: AC | PRN
  Administered 2014-08-01: 20 mL

## 2015-04-11 ENCOUNTER — Encounter (HOSPITAL_COMMUNITY): Payer: Self-pay | Admitting: General Practice

## 2015-04-13 ENCOUNTER — Other Ambulatory Visit: Payer: Self-pay | Admitting: Obstetrics and Gynecology

## 2015-04-17 ENCOUNTER — Other Ambulatory Visit (HOSPITAL_COMMUNITY): Payer: Self-pay | Admitting: Obstetrics and Gynecology

## 2015-04-17 NOTE — H&P (Signed)
Addi V Breighner is a 34 y.o. female G 0 presents for her fourth laparoscopic excision of endometriosis due to increased pelvic pain, menorrhagia and a desire to conceive.  Over the past year the patient's 6 day menstrual flow requires her to change a tampon and pad 5 times a day and will still result in soiling of clothing on occasion.  She has cramping that she rates as 10/10 on a 10 point pain scale that will begin prior to her menses and decreases to 7/10 on a 10 point pain scale once she takes 1600 mg of Ibuprofen at a time (patient counseled on the dangers of improper dosing of NSAIDS).  At times she will spot before her period, has positional dyspareunia and experience pain with bowel movements.  She denies any urinary symptoms or vaginitis issues. Given the chronicity of  endometriosis,  increased symptoms over the past six months, and the patient's desire to conceive she has decided to proceed with laparoscopic excision of endometriosis and chromo-pertubation.   Past Medical History  OB History: G:0  GYN History: menarche:34YO    LMP: 03/23/2015    Contracepton no method  The patient denies history of sexually transmitted disease.  Denies history of abnormal PAP smear;  Last PAP smear: 07/2014-normal  Medical History: Endometriosis,  Asthma, Vitamin D Deficiency and Menstrual Headache  Surgical History: 2004 Finger Repair;   2007, 2009 and 2011  Laparoscopy for Endometriosis;  2015 Laparoscopic Umbilical Hernia Repair Denies problems with anesthesia or history of blood transfusions  Family History: Hypertension   Social History: Married and employed as Project Manager with [employer];  Denies tobacco use and rarely uses tobacco   Outpatient Encounter Prescriptions as of 04/17/2015  Medication Sig  . albuterol (PROVENTIL HFA;VENTOLIN HFA) 108 (90 BASE) MCG/ACT inhaler Inhale into the lungs every 6 (six) hours as needed for wheezing or shortness of breath.  . calcium carbonate (TUMS EX) 750  MG chewable tablet Chew 2 tablets by mouth 2 (two) times daily as needed for heartburn.  . ibuprofen (ADVIL,MOTRIN) 800 MG tablet Take 800 mg by mouth every 8 (eight) hours as needed for cramping.    No facility-administered encounter medications on file as of 04/17/2015.    Allergies  Allergen Reactions  . Metronidazole Nausea And Vomiting  . Sulfonamide Derivatives Hives    Denies sensitivity to peanuts, shellfish, soy, latex or adhesives.    ROS: Admits to glasses and constipation but  denies headache, vision changes, nasal congestion, dysphagia, tinnitus, dizziness, hoarseness, cough,  chest pain, shortness of breath, nausea, vomiting, diarrhea, urinary frequency, urgency  dysuria, hematuria, vaginitis symptoms,  swelling of joints,easy bruising,  myalgias, arthralgias, skin rashes, unexplained weight loss and except as is mentioned in the history of present illness, patient's review of systems is otherwise negative.  Physical Exam  Bp: 90/58   P: 64  R: 20  Temperature: 98.6 degrees F orally   Weight: 121 lbs.  Height: 5' 3"  BMI: 21.4  Neck: supple without masses or thyromegaly Lungs: clear to auscultation Heart: regular rate and rhythm Abdomen: soft and difusely tender and no organomegaly Pelvic:EGBUS- wnl; vagina-normal rugae; uterus-normal size but tender, cervix without lesions or motion tenderness; adnexae-bilateral tenderness but no palpable  masses Extremities:  no clubbing, cyanosis or edema   Assesment: Chronic Pelvic Pain   Menorrhagia   Endometriosis    Disposition:  A discussion was held with patient regarding the indication for her procedure(s) along with the risks, which include but   are not limited to: reaction to anesthesia, damage to adjacent organs, infection and excessive bleeding. The patient verbalized understanding of these risks and has consented to proceed with a Diagnostic Laparoscopy with Excision of Endometriosis and [facility] on Apr 20, 2015.  CSN# [id number]   Maygan Koeller J. Lowell GuitarPowell, PA-C  for Dr. Woodroe ModeAngela Y. Su Hiltoberts

## 2015-04-19 MED ORDER — CEFAZOLIN SODIUM-DEXTROSE 2-3 GM-% IV SOLR
2.0000 g | INTRAVENOUS | Status: AC
  Administered 2015-04-20: 2 g via INTRAVENOUS

## 2015-04-20 ENCOUNTER — Encounter (HOSPITAL_COMMUNITY): Payer: Self-pay | Admitting: Emergency Medicine

## 2015-04-20 ENCOUNTER — Ambulatory Visit (HOSPITAL_COMMUNITY): Payer: 59 | Admitting: Anesthesiology

## 2015-04-20 ENCOUNTER — Ambulatory Visit (HOSPITAL_COMMUNITY)
Admission: RE | Admit: 2015-04-20 | Discharge: 2015-04-20 | Disposition: A | Discharge status: Home / Self Care | Payer: 59 | Source: Ambulatory Visit | Attending: Obstetrics and Gynecology | Admitting: Obstetrics and Gynecology

## 2015-04-20 ENCOUNTER — Encounter (HOSPITAL_COMMUNITY)
Admission: RE | Disposition: A | Discharge status: Home / Self Care | Payer: Self-pay | Source: Ambulatory Visit | Attending: Obstetrics and Gynecology

## 2015-04-20 DIAGNOSIS — N806 Endometriosis in cutaneous scar: Secondary | ICD-10-CM | POA: Insufficient documentation

## 2015-04-20 DIAGNOSIS — N920 Excessive and frequent menstruation with regular cycle: Secondary | ICD-10-CM | POA: Insufficient documentation

## 2015-04-20 DIAGNOSIS — N736 Female pelvic peritoneal adhesions (postinfective): Secondary | ICD-10-CM | POA: Insufficient documentation

## 2015-04-20 DIAGNOSIS — M25551 Pain in right hip: Secondary | ICD-10-CM

## 2015-04-20 DIAGNOSIS — Z882 Allergy status to sulfonamides status: Secondary | ICD-10-CM | POA: Insufficient documentation

## 2015-04-20 DIAGNOSIS — R102 Pelvic and perineal pain: Secondary | ICD-10-CM | POA: Diagnosis present

## 2015-04-20 DIAGNOSIS — E559 Vitamin D deficiency, unspecified: Secondary | ICD-10-CM | POA: Insufficient documentation

## 2015-04-20 DIAGNOSIS — Z888 Allergy status to other drugs, medicaments and biological substances status: Secondary | ICD-10-CM | POA: Diagnosis not present

## 2015-04-20 DIAGNOSIS — N809 Endometriosis, unspecified: Secondary | ICD-10-CM

## 2015-04-20 DIAGNOSIS — J45909 Unspecified asthma, uncomplicated: Secondary | ICD-10-CM | POA: Insufficient documentation

## 2015-04-20 HISTORY — DX: Other specified postprocedural states: R11.2

## 2015-04-20 HISTORY — PX: LAPAROSCOPY: SHX197

## 2015-04-20 HISTORY — DX: Other specified postprocedural states: Z98.890

## 2015-04-20 HISTORY — PX: DILATATION & CURRETTAGE/HYSTEROSCOPY WITH RESECTOCOPE: SHX5572

## 2015-04-20 HISTORY — PX: CHROMOPERTUBATION: SHX6288

## 2015-04-20 LAB — CBC
HCT: 33.8 % — ABNORMAL LOW (ref 36.0–46.0)
HEMOGLOBIN: 11.4 g/dL — AB (ref 12.0–15.0)
MCH: 28.9 pg (ref 26.0–34.0)
MCHC: 33.7 g/dL (ref 30.0–36.0)
MCV: 85.6 fL (ref 78.0–100.0)
Platelets: 259 10*3/uL (ref 150–400)
RBC: 3.95 MIL/uL (ref 3.87–5.11)
RDW: 12.9 % (ref 11.5–15.5)
WBC: 3.9 10*3/uL — ABNORMAL LOW (ref 4.0–10.5)

## 2015-04-20 LAB — PREGNANCY, URINE: Preg Test, Ur: NEGATIVE

## 2015-04-20 SURGERY — LAPAROSCOPY OPERATIVE
Anesthesia: General

## 2015-04-20 MED ORDER — OXYCODONE-ACETAMINOPHEN 5-325 MG PO TABS: 1.0000 | ORAL_TABLET | Freq: Four times a day (QID) | ORAL | Status: DC | PRN

## 2015-04-20 MED ORDER — DEXAMETHASONE SODIUM PHOSPHATE 4 MG/ML IJ SOLN
INTRAMUSCULAR | Status: AC
  Filled 2015-04-20: qty 1

## 2015-04-20 MED ORDER — LIDOCAINE HCL 1 % IJ SOLN
INTRAMUSCULAR | Status: AC
  Filled 2015-04-20: qty 20

## 2015-04-20 MED ORDER — IBUPROFEN 800 MG PO TABS: ORAL_TABLET | ORAL | Status: DC

## 2015-04-20 MED ORDER — BUPIVACAINE HCL (PF) 0.25 % IJ SOLN
INTRAMUSCULAR | Status: DC | PRN
  Administered 2015-04-20: 13 mL

## 2015-04-20 MED ORDER — ACETAMINOPHEN 160 MG/5ML PO SOLN: 650.0000 mg | Freq: Once | ORAL | Status: DC

## 2015-04-20 MED ORDER — SCOPOLAMINE 1 MG/3DAYS TD PT72
MEDICATED_PATCH | TRANSDERMAL | Status: AC
  Administered 2015-04-20: 1.5 mg via TRANSDERMAL
  Filled 2015-04-20: qty 1

## 2015-04-20 MED ORDER — GLYCOPYRROLATE 0.2 MG/ML IJ SOLN
INTRAMUSCULAR | Status: AC
  Filled 2015-04-20: qty 2

## 2015-04-20 MED ORDER — NEOSTIGMINE METHYLSULFATE 10 MG/10ML IV SOLN
INTRAVENOUS | Status: DC | PRN
  Administered 2015-04-20: 3.5 mg via INTRAVENOUS

## 2015-04-20 MED ORDER — ROCURONIUM BROMIDE 100 MG/10ML IV SOLN
INTRAVENOUS | Status: AC
  Filled 2015-04-20: qty 1

## 2015-04-20 MED ORDER — FENTANYL CITRATE (PF) 250 MCG/5ML IJ SOLN
INTRAMUSCULAR | Status: AC
  Filled 2015-04-20: qty 5

## 2015-04-20 MED ORDER — MIDAZOLAM HCL 2 MG/2ML IJ SOLN
INTRAMUSCULAR | Status: AC
  Filled 2015-04-20: qty 2

## 2015-04-20 MED ORDER — SCOPOLAMINE 1 MG/3DAYS TD PT72
1.0000 | MEDICATED_PATCH | Freq: Once | TRANSDERMAL | Status: DC
  Administered 2015-04-20: 1.5 mg via TRANSDERMAL

## 2015-04-20 MED ORDER — KETOROLAC TROMETHAMINE 30 MG/ML IJ SOLN
INTRAMUSCULAR | Status: AC
  Filled 2015-04-20: qty 1

## 2015-04-20 MED ORDER — BUPIVACAINE HCL (PF) 0.25 % IJ SOLN
INTRAMUSCULAR | Status: AC
  Filled 2015-04-20: qty 30

## 2015-04-20 MED ORDER — LIDOCAINE HCL (CARDIAC) 20 MG/ML IV SOLN
INTRAVENOUS | Status: DC | PRN
  Administered 2015-04-20: 50 mg via INTRAVENOUS

## 2015-04-20 MED ORDER — ROCURONIUM BROMIDE 100 MG/10ML IV SOLN
INTRAVENOUS | Status: DC | PRN
  Administered 2015-04-20: 15 mg via INTRAVENOUS
  Administered 2015-04-20: 35 mg via INTRAVENOUS

## 2015-04-20 MED ORDER — FENTANYL CITRATE (PF) 100 MCG/2ML IJ SOLN
INTRAMUSCULAR | Status: AC
  Filled 2015-04-20: qty 2

## 2015-04-20 MED ORDER — ONDANSETRON HCL 4 MG/2ML IJ SOLN
INTRAMUSCULAR | Status: AC
  Filled 2015-04-20: qty 2

## 2015-04-20 MED ORDER — LIDOCAINE HCL (CARDIAC) 20 MG/ML IV SOLN
INTRAVENOUS | Status: AC
  Filled 2015-04-20: qty 5

## 2015-04-20 MED ORDER — HEPARIN SODIUM (PORCINE) 5000 UNIT/ML IJ SOLN
INTRAMUSCULAR | Status: AC
  Filled 2015-04-20: qty 1

## 2015-04-20 MED ORDER — ACETAMINOPHEN 160 MG/5ML PO SOLN
ORAL | Status: AC
  Filled 2015-04-20: qty 40.6

## 2015-04-20 MED ORDER — PROPOFOL 10 MG/ML IV BOLUS
INTRAVENOUS | Status: AC
  Filled 2015-04-20: qty 20

## 2015-04-20 MED ORDER — PROPOFOL 10 MG/ML IV BOLUS
INTRAVENOUS | Status: DC | PRN
  Administered 2015-04-20: 180 mg via INTRAVENOUS

## 2015-04-20 MED ORDER — DEXAMETHASONE SODIUM PHOSPHATE 10 MG/ML IJ SOLN
INTRAMUSCULAR | Status: DC | PRN
  Administered 2015-04-20: 4 mg via INTRAVENOUS

## 2015-04-20 MED ORDER — LACTATED RINGERS IV SOLN
INTRAVENOUS | Status: DC
Start: 2015-04-20 — End: 2015-04-20
  Administered 2015-04-20 (×2): via INTRAVENOUS

## 2015-04-20 MED ORDER — ALBUTEROL SULFATE HFA 108 (90 BASE) MCG/ACT IN AERS
INHALATION_SPRAY | RESPIRATORY_TRACT | Status: AC
  Filled 2015-04-20: qty 6.7

## 2015-04-20 MED ORDER — KETOROLAC TROMETHAMINE 30 MG/ML IJ SOLN
INTRAMUSCULAR | Status: DC | PRN
  Administered 2015-04-20: 30 mg via INTRAVENOUS

## 2015-04-20 MED ORDER — METHYLENE BLUE 1 % INJ SOLN
INTRAMUSCULAR | Status: AC
  Filled 2015-04-20: qty 1

## 2015-04-20 MED ORDER — CEFAZOLIN SODIUM-DEXTROSE 2-3 GM-% IV SOLR
INTRAVENOUS | Status: AC
  Filled 2015-04-20: qty 50

## 2015-04-20 MED ORDER — GLYCOPYRROLATE 0.2 MG/ML IJ SOLN
INTRAMUSCULAR | Status: DC | PRN
  Administered 2015-04-20: 0.4 mg via INTRAVENOUS

## 2015-04-20 MED ORDER — ONDANSETRON HCL 4 MG/2ML IJ SOLN
INTRAMUSCULAR | Status: DC | PRN
  Administered 2015-04-20: 4 mg via INTRAVENOUS

## 2015-04-20 MED ORDER — FENTANYL CITRATE (PF) 100 MCG/2ML IJ SOLN
INTRAMUSCULAR | Status: DC | PRN
  Administered 2015-04-20 (×2): 100 ug via INTRAVENOUS
  Administered 2015-04-20: 50 ug via INTRAVENOUS
  Administered 2015-04-20: 100 ug via INTRAVENOUS

## 2015-04-20 MED ORDER — NEOSTIGMINE METHYLSULFATE 10 MG/10ML IV SOLN
INTRAVENOUS | Status: AC
  Filled 2015-04-20: qty 1

## 2015-04-20 MED ORDER — METHYLENE BLUE 1 % INJ SOLN
INTRAMUSCULAR | Status: DC | PRN
  Administered 2015-04-20: 30 mL via SUBMUCOSAL

## 2015-04-20 MED ORDER — MIDAZOLAM HCL 2 MG/2ML IJ SOLN
INTRAMUSCULAR | Status: DC | PRN
  Administered 2015-04-20: 2 mg via INTRAVENOUS

## 2015-04-20 SURGICAL SUPPLY — 34 items
BAG SPEC RTRVL LRG 6X4 10 (ENDOMECHANICALS)
CABLE HIGH FREQUENCY MONO STRZ (ELECTRODE) IMPLANT
CHLORAPREP W/TINT 26ML (MISCELLANEOUS) ×4 IMPLANT
CLOTH BEACON ORANGE TIMEOUT ST (SAFETY) ×4 IMPLANT
DRSG COVADERM PLUS 2X2 (GAUZE/BANDAGES/DRESSINGS) ×8 IMPLANT
DRSG OPSITE POSTOP 3X4 (GAUZE/BANDAGES/DRESSINGS) ×2 IMPLANT
FORCEPS CUTTING 33CM 5MM (CUTTING FORCEPS) IMPLANT
FORCEPS CUTTING 45CM 5MM (CUTTING FORCEPS) IMPLANT
GLOVE BIO SURGEON STRL SZ7.5 (GLOVE) ×4 IMPLANT
GLOVE BIOGEL PI IND STRL 7.5 (GLOVE) ×2 IMPLANT
GLOVE BIOGEL PI INDICATOR 7.5 (GLOVE) ×2
GOWN STRL REUS W/TWL LRG LVL3 (GOWN DISPOSABLE) ×8 IMPLANT
LIQUID BAND (GAUZE/BANDAGES/DRESSINGS) ×4 IMPLANT
NEEDLE INSUFFLATION 120MM (ENDOMECHANICALS) IMPLANT
NS IRRIG 1000ML POUR BTL (IV SOLUTION) ×4 IMPLANT
PACK LAPAROSCOPY BASIN (CUSTOM PROCEDURE TRAY) ×4 IMPLANT
PAD POSITIONER PINK NONSTERILE (MISCELLANEOUS) ×4 IMPLANT
POUCH SPECIMEN RETRIEVAL 10MM (ENDOMECHANICALS) IMPLANT
SET IRRIG TUBING LAPAROSCOPIC (IRRIGATION / IRRIGATOR) IMPLANT
SLEEVE XCEL OPT CAN 5 100 (ENDOMECHANICALS) ×2 IMPLANT
SOLUTION ELECTROLUBE (MISCELLANEOUS) IMPLANT
SUT MNCRL AB 3-0 PS2 27 (SUTURE) ×6 IMPLANT
SUT VICRYL 0 ENDOLOOP (SUTURE) IMPLANT
SUT VICRYL 0 TIES 12 18 (SUTURE) IMPLANT
SUT VICRYL 0 UR6 27IN ABS (SUTURE) ×4 IMPLANT
SYR 50ML LL SCALE MARK (SYRINGE) IMPLANT
SYRINGE 20CC LL (MISCELLANEOUS) ×2 IMPLANT
TOWEL OR 17X24 6PK STRL BLUE (TOWEL DISPOSABLE) ×8 IMPLANT
TRAY FOLEY CATH SILVER 14FR (SET/KITS/TRAYS/PACK) ×4 IMPLANT
TROCAR BALLN 12MMX100 BLUNT (TROCAR) ×2 IMPLANT
TROCAR XCEL NON-BLD 11X100MML (ENDOMECHANICALS) ×4 IMPLANT
TROCAR XCEL NON-BLD 5MMX100MML (ENDOMECHANICALS) ×4 IMPLANT
WARMER LAPAROSCOPE (MISCELLANEOUS) ×4 IMPLANT
WATER STERILE IRR 1000ML POUR (IV SOLUTION) ×4 IMPLANT

## 2015-04-20 NOTE — Anesthesia Postprocedure Evaluation (Signed)
  Anesthesia Post-op Note  Patient: Heather Pena  Procedure(s) Performed: Procedure(s) (LRB): LAPAROSCOPY OPERATIVE (N/A) CHROMOPERTUBATION (N/A) DILATATION & CURETTAGE/HYSTEROSCOPY WITH RESECTOCOPE  Patient Location: PACU  Anesthesia Type: General  Level of Consciousness: awake and alert   Airway and Oxygen Therapy: Patient Spontanous Breathing  Post-op Pain: mild  Post-op Assessment: Post-op Vital signs reviewed, Patient's Cardiovascular Status Stable, Respiratory Function Stable, Patent Airway and No signs of Nausea or vomiting  Last Vitals:  Filed Vitals:   04/20/15 1500  BP: 99/48  Pulse: 70  Temp:   Resp: 20    Post-op Vital Signs: stable   Complications: No apparent anesthesia complications

## 2015-04-20 NOTE — Transfer of Care (Signed)
Immediate Anesthesia Transfer of Care Note  Patient: Ermalinda BarriosQuanesha V Spoelstra  Procedure(s) Performed: Procedure(s): LAPAROSCOPY OPERATIVE (N/A) CHROMOPERTUBATION (N/A) DILATATION & CURETTAGE/HYSTEROSCOPY WITH RESECTOCOPE  Patient Location: PACU  Anesthesia Type:General  Level of Consciousness: awake, alert  and oriented  Airway & Oxygen Therapy: Patient Spontanous Breathing and Patient connected to nasal cannula oxygen  Post-op Assessment: Report given to RN and Post -op Vital signs reviewed and stable  Post vital signs: Reviewed and stable  Last Vitals:  Filed Vitals:   04/20/15 1043  BP: 129/71  Pulse: 74  Temp: 36.7 C  Resp: 16    Complications: No apparent anesthesia complications

## 2015-04-20 NOTE — Op Note (Signed)
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  No intrauterine lesions noted.  Pathology: Endometrial Curettings and umbilical mass   Fluids: 1600cc  UOP: 120cc  EBL: Minimal  Complications: None  Procedure: The patient was taken to the operating room after the risks, benefits, alternatives, complications, treatment options, and expected outcomes were discussed with the patient. The patient verbalized understanding, the patient concurred with the proposed plan and consent signed and witnessed. The patient was taken to the Operating Room, identified as Heather Pena and the procedure verified as Dx laparoscopy, Chromopertubation, Excision of Umbilical Mass, Hysteroscopy and D&C.  A Time Out was held and the above information confirmed.  The patient was placed under general anesthesia per anesthesia staff, the patient was placed in modified dorsal lithotomy position and was prepped, draped, and catheterized in the normal, sterile fashion.  The cervix was visualized and an intrauterine manipulator was placed.  A 10 mm umbilical incision was then performed and carried down through the underlying fascia and peritoneum.  A purse string stitch of 0 vircryl was placed in the fascia.   A 10 mm trocar was advanced into the intraabdominal cavity, the operative laparoscope was introduced and findings as noted above.  Patient was placed in trendelenburg and marcaine injected in the LLQ and a 5 mm incision was made and 5 mm trocar advanced into the intraabdominal  cavity.  Dense adhesions noted throughout the pelvis.  Endometriotic implants noted on the anterior abdominal wall above the liver.  Dense adhesions of bowel to uterus to the extent that visualization of the uterus was obscured.  Anterior and posterior cul-de-sacs were obliterated.  The patient was taken out of tredelenburg and pneumoperitoneum relieved while removing 5mm trochar under direct visualization.  The 10mm trochar was removed as well and purse string stitch tied.  The umbilical mass was excised and sent to pathology.  The umbilical incisions were repaired with 3-0 monocryl via a subcuticular stitch.  Attention was then turned to the perineum.  A bivalve speculum was placed in the patient's vagina and the anterior lip of the cervix was grasped with a single tooth tenaculum.  The uterus sounded to 9 cm. The cervix was dilated for passage of the hysteroscope.  The hysteroscope was introduced into the uterine cavity and findings as noted above. Sharp curettage was performed until a gritty texture was noted and curettings were sent to pathology. The hysteroscope was reintroduced and no obvious intracavitary lesions were noted.  All instruments were removed. Sponge lap and needle count was correct. The patient tolerated the procedure well and was returned to the recovery room in good condition.

## 2015-04-20 NOTE — H&P (View-Only) (Signed)
Ermalinda BarriosQuanesha V Liao is a 34 y.o. female G 0 presents for her fourth laparoscopic excision of endometriosis due to increased pelvic pain, menorrhagia and a desire to conceive.  Over the past year the patient's 6 day menstrual flow requires her to change a tampon and pad 5 times a day and will still result in soiling of clothing on occasion.  She has cramping that she rates as 10/10 on a 10 point pain scale that will begin prior to her menses and decreases to 7/10 on a 10 point pain scale once she takes 1600 mg of Ibuprofen at a time (patient counseled on the dangers of improper dosing of NSAIDS).  At times she will spot before her period, has positional dyspareunia and experience pain with bowel movements.  She denies any urinary symptoms or vaginitis issues. Given the chronicity of  endometriosis,  increased symptoms over the past six months, and the patient's desire to conceive she has decided to proceed with laparoscopic excision of endometriosis and chromo-pertubation.   Past Medical History  OB History: G:0  GYN History: menarche:34YO    LMP: 03/23/2015    Contracepton no method  The patient denies history of sexually transmitted disease.  Denies history of abnormal PAP smear;  Last PAP smear: 07/2014-normal  Medical History: Endometriosis,  Asthma, Vitamin D Deficiency and Menstrual Headache  Surgical History: 2004 Finger Repair;   2007, 2009 and 2011  Laparoscopy for Endometriosis;  2015 Laparoscopic Umbilical Hernia Repair Denies problems with anesthesia or history of blood transfusions  Family History: Hypertension   Social History: Married and employed as Emergency planning/management officerroject Manager with [employer];  Denies tobacco use and rarely uses tobacco   Outpatient Encounter Prescriptions as of 04/17/2015  Medication Sig  . albuterol (PROVENTIL HFA;VENTOLIN HFA) 108 (90 BASE) MCG/ACT inhaler Inhale into the lungs every 6 (six) hours as needed for wheezing or shortness of breath.  . calcium carbonate (TUMS EX) 750  MG chewable tablet Chew 2 tablets by mouth 2 (two) times daily as needed for heartburn.  Marland Kitchen. ibuprofen (ADVIL,MOTRIN) 800 MG tablet Take 800 mg by mouth every 8 (eight) hours as needed for cramping.    No facility-administered encounter medications on file as of 04/17/2015.    Allergies  Allergen Reactions  . Metronidazole Nausea And Vomiting  . Sulfonamide Derivatives Hives    Denies sensitivity to peanuts, shellfish, soy, latex or adhesives.    ROS: Admits to glasses and constipation but  denies headache, vision changes, nasal congestion, dysphagia, tinnitus, dizziness, hoarseness, cough,  chest pain, shortness of breath, nausea, vomiting, diarrhea, urinary frequency, urgency  dysuria, hematuria, vaginitis symptoms,  swelling of joints,easy bruising,  myalgias, arthralgias, skin rashes, unexplained weight loss and except as is mentioned in the history of present illness, patient's review of systems is otherwise negative.  Physical Exam  Bp: 90/58   P: 64  R: 20  Temperature: 98.6 degrees F orally   Weight: 121 lbs.  Height: 5\' 3"   BMI: 21.4  Neck: supple without masses or thyromegaly Lungs: clear to auscultation Heart: regular rate and rhythm Abdomen: soft and difusely tender and no organomegaly Pelvic:EGBUS- wnl; vagina-normal rugae; uterus-normal size but tender, cervix without lesions or motion tenderness; adnexae-bilateral tenderness but no palpable  masses Extremities:  no clubbing, cyanosis or edema   Assesment: Chronic Pelvic Pain   Menorrhagia   Endometriosis    Disposition:  A discussion was held with patient regarding the indication for her procedure(s) along with the risks, which include but  are not limited to: reaction to anesthesia, damage to adjacent organs, infection and excessive bleeding. The patient verbalized understanding of these risks and has consented to proceed with a Diagnostic Laparoscopy with Excision of Endometriosis and [facility] on Apr [facility], 2015.  CSN# [id number]   Jenne Sellinger J. Lowell GuitarPowell, PA-C  for Dr. Woodroe ModeAngela Y. Su Hiltoberts

## 2015-04-20 NOTE — Anesthesia Preprocedure Evaluation (Signed)
Anesthesia Evaluation  Patient identified by MRN, date of birth, ID band Patient awake    Reviewed: Allergy & Precautions, H&P , Patient's Chart, lab work & pertinent test results, reviewed documented beta blocker date and time   Airway Mallampati: II TM Distance: >3 FB Neck ROM: full    Dental no notable dental hx.    Pulmonary asthma ,  breath sounds clear to auscultation  Pulmonary exam normal       Cardiovascular Rhythm:regular Rate:Normal     Neuro/Psych    GI/Hepatic   Endo/Other    Renal/GU      Musculoskeletal   Abdominal   Peds  Hematology   Anesthesia Other Findings   Reproductive/Obstetrics                           Anesthesia Physical Anesthesia Plan  ASA: II  Anesthesia Plan: General   Post-op Pain Management:    Induction: Intravenous  Airway Management Planned: Oral ETT  Additional Equipment:   Intra-op Plan:   Post-operative Plan: Extubation in OR  Informed Consent: I have reviewed the patients History and Physical, chart, labs and discussed the procedure including the risks, benefits and alternatives for the proposed anesthesia with the patient or authorized representative who has indicated his/her understanding and acceptance.   Dental Advisory Given and Dental advisory given  Plan Discussed with: CRNA and Surgeon  Anesthesia Plan Comments: (  Discussed general anesthesia, including possible nausea, instrumentation of airway, sore throat,pulmonary aspiration, etc. I asked if the were any outstanding questions, or  concerns before we proceeded. )        Anesthesia Quick Evaluation  

## 2015-04-20 NOTE — Discharge Instructions (Signed)
Call [facility] @ [phone] if:  You have a temperature greater than or equal to 100.4 degrees Farenheit orally You have pain that is not made better by the pain medication given and taken as directed You have excessive bleeding   Take Colace (Docusate Sodium/Stool Softener) 100 mg 2-3 times daily while taking narcotic pain medicine to avoid constipation or until bowel movements are regular.  You may drive after 24 hours  You may shower tomorrow You may resume a regular diet today  Keep incisions clean and dry Avoid anything in vagina  until after your post-operative visit    Post Anesthesia Home Care Instructions  Activity: Get plenty of rest for the remainder of the day. A responsible adult should stay with you for 24 hours following the procedure.  For the next 24 hours, DO NOT: -Drive a car -Advertising copywriterperate machinery -Drink alcoholic beverages -Take any medication unless instructed by your physician -Make any legal decisions or sign important papers.  Meals: Start with liquid foods such as gelatin or soup. Progress to regular foods as tolerated. Avoid greasy, spicy, heavy foods. If nausea and/or vomiting occur, drink only clear liquids until the nausea and/or vomiting subsides. Call your physician if vomiting continues.  Special Instructions/Symptoms: Your throat may feel dry or sore from the anesthesia or the breathing tube placed in your throat during surgery. If this causes discomfort, gargle with warm salt water. The discomfort should disappear within 24 hours.  If you had a scopolamine patch placed behind your ear for the management of post- operative nausea and/or vomiting:  1. The medication in the patch is effective for 72 hours, after which it should be removed.  Wrap patch in a tissue and discard in the trash. Wash hands thoroughly with soap and water. 2. You may remove the patch earlier than 72 hours if you experience unpleasant side effects which may  include dry mouth, dizziness or visual disturbances. 3. Avoid touching the patch. Wash your hands with soap and water after contact with the patch.

## 2015-04-20 NOTE — Anesthesia Procedure Notes (Signed)
Procedure Name: Intubation Date/Time: 04/20/2015 11:54 AM Performed by: Shanon PayorGREGORY, Eleanor Gatliff M Pre-anesthesia Checklist: Patient identified, Emergency Drugs available, Suction available, Patient being monitored and Timeout performed Patient Re-evaluated:Patient Re-evaluated prior to inductionOxygen Delivery Method: Circle system utilized Preoxygenation: Pre-oxygenation with 100% oxygen Intubation Type: IV induction Ventilation: Mask ventilation without difficulty Tube type: Oral Tube size: 7.0 mm Number of attempts: 1 Airway Equipment and Method: Lighted stylet Placement Confirmation: positive ETCO2 and breath sounds checked- equal and bilateral Secured at: 21 cm Tube secured with: Tape Dental Injury: Teeth and Oropharynx as per pre-operative assessment

## 2015-04-20 NOTE — Interval H&P Note (Signed)
History and Physical Interval Note:  04/20/2015 11:33 AM  Ermalinda BarriosQuanesha V Butchko  has presented today for surgery, with the diagnosis of Endometriosis in Cutaneous Scar  The various methods of treatment have been discussed with the patient and family. After consideration of risks, benefits and other options for treatment, the patient has consented to  Procedure(s): LAPAROSCOPY OPERATIVE (N/A) CHROMOPERTUBATION (N/A) HYSTEROSCOPY, DILATION AND CURETTAGE AND POSSIBLE RESECTION as a surgical intervention .  The patient's history has been reviewed, patient examined, no change in status, stable for surgery.  I have reviewed the patient's chart and labs.  Questions were answered to the patient's satisfaction.     Vern Prestia Y  Will also being doing hysteroscopy, D&C, possible resection as well secondary to menorrhagia with possibility of submucosal myoma seen on HSG.  I reviewed the procedures and discussed risks benefits and alternatives.  Questions answered and consent signed and witnessed.

## 2015-04-21 ENCOUNTER — Encounter (HOSPITAL_COMMUNITY): Payer: Self-pay | Admitting: Obstetrics and Gynecology

## 2016-10-07 ENCOUNTER — Other Ambulatory Visit: Payer: Self-pay | Admitting: Obstetrics and Gynecology

## 2016-10-07 DIAGNOSIS — E049 Nontoxic goiter, unspecified: Secondary | ICD-10-CM

## 2016-10-09 ENCOUNTER — Ambulatory Visit
Admission: RE | Admit: 2016-10-09 | Discharge: 2016-10-09 | Disposition: A | Discharge status: Home / Self Care | Payer: 59 | Source: Ambulatory Visit | Attending: Obstetrics and Gynecology | Admitting: Obstetrics and Gynecology

## 2016-10-09 DIAGNOSIS — E049 Nontoxic goiter, unspecified: Secondary | ICD-10-CM

## 2017-07-08 LAB — OB RESULTS CONSOLE RPR: RPR: NONREACTIVE

## 2017-07-08 LAB — OB RESULTS CONSOLE HEPATITIS B SURFACE ANTIGEN: Hepatitis B Surface Ag: NEGATIVE

## 2017-07-08 LAB — OB RESULTS CONSOLE ABO/RH: RH TYPE: POSITIVE

## 2017-07-08 LAB — OB RESULTS CONSOLE HIV ANTIBODY (ROUTINE TESTING): HIV: NONREACTIVE

## 2017-07-08 LAB — OB RESULTS CONSOLE GC/CHLAMYDIA
Chlamydia: NEGATIVE
Gonorrhea: NEGATIVE

## 2017-07-08 LAB — OB RESULTS CONSOLE ANTIBODY SCREEN: Antibody Screen: NEGATIVE

## 2017-07-08 LAB — OB RESULTS CONSOLE RUBELLA ANTIBODY, IGM: Rubella: IMMUNE

## 2017-12-24 LAB — OB RESULTS CONSOLE GBS: STREP GROUP B AG: NEGATIVE

## 2018-01-15 ENCOUNTER — Other Ambulatory Visit: Payer: Self-pay | Admitting: Obstetrics and Gynecology

## 2018-01-15 ENCOUNTER — Telehealth (HOSPITAL_COMMUNITY): Payer: Self-pay | Admitting: *Deleted

## 2018-01-15 ENCOUNTER — Encounter (HOSPITAL_COMMUNITY): Payer: Self-pay | Admitting: *Deleted

## 2018-01-15 NOTE — Telephone Encounter (Signed)
Preadmission screen  

## 2018-01-23 ENCOUNTER — Other Ambulatory Visit: Payer: Self-pay

## 2018-01-23 ENCOUNTER — Inpatient Hospital Stay (HOSPITAL_COMMUNITY)
Admission: RE | Admit: 2018-01-23 | Discharge: 2018-01-27 | DRG: 783 | Disposition: A | Discharge status: Skilled Nursing Facility | Payer: BLUE CROSS/BLUE SHIELD | Source: Ambulatory Visit | Attending: Obstetrics and Gynecology | Admitting: Obstetrics and Gynecology

## 2018-01-23 ENCOUNTER — Inpatient Hospital Stay (HOSPITAL_COMMUNITY): Admission: AD | Admit: 2018-01-23 | Payer: 59 | Source: Ambulatory Visit | Admitting: Obstetrics and Gynecology

## 2018-01-23 ENCOUNTER — Inpatient Hospital Stay (HOSPITAL_COMMUNITY): Payer: BLUE CROSS/BLUE SHIELD | Admitting: Anesthesiology

## 2018-01-23 ENCOUNTER — Encounter (HOSPITAL_COMMUNITY): Payer: Self-pay

## 2018-01-23 DIAGNOSIS — O9902 Anemia complicating childbirth: Secondary | ICD-10-CM | POA: Diagnosis present

## 2018-01-23 DIAGNOSIS — J9690 Respiratory failure, unspecified, unspecified whether with hypoxia or hypercapnia: Secondary | ICD-10-CM

## 2018-01-23 DIAGNOSIS — O9912 Other diseases of the blood and blood-forming organs and certain disorders involving the immune mechanism complicating childbirth: Secondary | ICD-10-CM | POA: Diagnosis present

## 2018-01-23 DIAGNOSIS — D62 Acute posthemorrhagic anemia: Secondary | ICD-10-CM | POA: Diagnosis not present

## 2018-01-23 DIAGNOSIS — Z0189 Encounter for other specified special examinations: Secondary | ICD-10-CM

## 2018-01-23 DIAGNOSIS — E063 Autoimmune thyroiditis: Secondary | ICD-10-CM | POA: Diagnosis present

## 2018-01-23 DIAGNOSIS — J95821 Acute postprocedural respiratory failure: Secondary | ICD-10-CM | POA: Diagnosis not present

## 2018-01-23 DIAGNOSIS — Z978 Presence of other specified devices: Secondary | ICD-10-CM

## 2018-01-23 DIAGNOSIS — R578 Other shock: Secondary | ICD-10-CM | POA: Diagnosis not present

## 2018-01-23 DIAGNOSIS — O99284 Endocrine, nutritional and metabolic diseases complicating childbirth: Secondary | ICD-10-CM | POA: Diagnosis present

## 2018-01-23 DIAGNOSIS — O9952 Diseases of the respiratory system complicating childbirth: Secondary | ICD-10-CM | POA: Diagnosis present

## 2018-01-23 DIAGNOSIS — N856 Intrauterine synechiae: Secondary | ICD-10-CM | POA: Diagnosis present

## 2018-01-23 DIAGNOSIS — Z3A39 39 weeks gestation of pregnancy: Secondary | ICD-10-CM

## 2018-01-23 DIAGNOSIS — R58 Hemorrhage, not elsewhere classified: Secondary | ICD-10-CM | POA: Diagnosis present

## 2018-01-23 LAB — ABO/RH: ABO/RH(D): B POS

## 2018-01-23 LAB — CBC
HCT: 34.5 % — ABNORMAL LOW (ref 36.0–46.0)
Hemoglobin: 11.8 g/dL — ABNORMAL LOW (ref 12.0–15.0)
MCH: 30.9 pg (ref 26.0–34.0)
MCHC: 34.2 g/dL (ref 30.0–36.0)
MCV: 90.3 fL (ref 78.0–100.0)
Platelets: 219 10*3/uL (ref 150–400)
RBC: 3.82 MIL/uL — ABNORMAL LOW (ref 3.87–5.11)
RDW: 15.9 % — AB (ref 11.5–15.5)
WBC: 5.5 10*3/uL (ref 4.0–10.5)

## 2018-01-23 LAB — RPR: RPR: NONREACTIVE

## 2018-01-23 MED ORDER — TERBUTALINE SULFATE 1 MG/ML IJ SOLN: 0.2500 mg | Freq: Once | INTRAMUSCULAR | Status: DC | PRN

## 2018-01-23 MED ORDER — DIPHENHYDRAMINE HCL 50 MG/ML IJ SOLN
12.5000 mg | INTRAMUSCULAR | Status: DC | PRN
  Administered 2018-01-24: 12.5 mg via INTRAVENOUS
  Filled 2018-01-23: qty 1

## 2018-01-23 MED ORDER — LACTATED RINGERS IV SOLN
INTRAVENOUS | Status: DC
  Administered 2018-01-23 – 2018-01-24 (×6): via INTRAVENOUS

## 2018-01-23 MED ORDER — LACTATED RINGERS IV SOLN: 500.0000 mL | INTRAVENOUS | Status: DC | PRN

## 2018-01-23 MED ORDER — ONDANSETRON HCL 4 MG/2ML IJ SOLN
4.0000 mg | Freq: Four times a day (QID) | INTRAMUSCULAR | Status: DC | PRN
  Administered 2018-01-23: 4 mg via INTRAVENOUS
  Filled 2018-01-23: qty 2

## 2018-01-23 MED ORDER — BUTORPHANOL TARTRATE 1 MG/ML IJ SOLN
1.0000 mg | INTRAMUSCULAR | Status: DC | PRN
Start: 2018-01-23 — End: 2018-01-27
  Administered 2018-01-23 (×3): 1 mg via INTRAVENOUS
  Filled 2018-01-23 (×2): qty 1

## 2018-01-23 MED ORDER — SOD CITRATE-CITRIC ACID 500-334 MG/5ML PO SOLN
30.0000 mL | ORAL | Status: DC | PRN
  Administered 2018-01-23 – 2018-01-24 (×3): 30 mL via ORAL
  Filled 2018-01-23 (×3): qty 15

## 2018-01-23 MED ORDER — LIDOCAINE HCL (PF) 1 % IJ SOLN: 30.0000 mL | INTRAMUSCULAR | Status: DC | PRN

## 2018-01-23 MED ORDER — PHENYLEPHRINE 40 MCG/ML (10ML) SYRINGE FOR IV PUSH (FOR BLOOD PRESSURE SUPPORT)
80.0000 ug | PREFILLED_SYRINGE | INTRAVENOUS | Status: AC | PRN
Start: 2018-01-23 — End: 2018-01-24
  Administered 2018-01-24 (×2): 40 ug via INTRAVENOUS
  Administered 2018-01-24 (×2): 80 ug via INTRAVENOUS
  Administered 2018-01-24: 40 ug via INTRAVENOUS
  Administered 2018-01-24: 80 ug via INTRAVENOUS

## 2018-01-23 MED ORDER — ACETAMINOPHEN 325 MG PO TABS
650.0000 mg | ORAL_TABLET | ORAL | Status: DC | PRN
  Administered 2018-01-24: 650 mg via ORAL
  Filled 2018-01-23: qty 2

## 2018-01-23 MED ORDER — OXYTOCIN BOLUS FROM INFUSION: 500.0000 mL | Freq: Once | INTRAVENOUS | Status: DC

## 2018-01-23 MED ORDER — BUTORPHANOL TARTRATE 1 MG/ML IJ SOLN
INTRAMUSCULAR | Status: AC
  Filled 2018-01-23: qty 1

## 2018-01-23 MED ORDER — EPHEDRINE 5 MG/ML INJ: 10.0000 mg | INTRAVENOUS | Status: DC | PRN

## 2018-01-23 MED ORDER — LACTATED RINGERS IV SOLN
500.0000 mL | Freq: Once | INTRAVENOUS | Status: AC
  Administered 2018-01-23: 500 mL via INTRAVENOUS

## 2018-01-23 MED ORDER — OXYTOCIN 40 UNITS IN LACTATED RINGERS INFUSION - SIMPLE MED
1.0000 m[IU]/min | INTRAVENOUS | Status: DC
  Administered 2018-01-23: 2 m[IU]/min via INTRAVENOUS
  Administered 2018-01-23: 16 m[IU]/min via INTRAVENOUS
  Filled 2018-01-23: qty 1000

## 2018-01-23 MED ORDER — FENTANYL 2.5 MCG/ML BUPIVACAINE 1/10 % EPIDURAL INFUSION (WH - ANES)
14.0000 mL/h | INTRAMUSCULAR | Status: DC | PRN
  Administered 2018-01-23: 14 mL/h via EPIDURAL
  Filled 2018-01-23: qty 100

## 2018-01-23 MED ORDER — LIDOCAINE HCL (PF) 1 % IJ SOLN
INTRAMUSCULAR | Status: DC | PRN
  Administered 2018-01-23: 13 mL via EPIDURAL

## 2018-01-23 MED ORDER — OXYTOCIN 40 UNITS IN LACTATED RINGERS INFUSION - SIMPLE MED: 2.5000 [IU]/h | INTRAVENOUS | Status: DC

## 2018-01-23 MED ORDER — PHENYLEPHRINE 40 MCG/ML (10ML) SYRINGE FOR IV PUSH (FOR BLOOD PRESSURE SUPPORT)
80.0000 ug | PREFILLED_SYRINGE | INTRAVENOUS | Status: DC | PRN
  Filled 2018-01-23: qty 10

## 2018-01-23 NOTE — Anesthesia Procedure Notes (Signed)
Epidural Patient location during procedure: OB Start time: 01/23/2018 9:26 PM End time: 01/23/2018 9:41 PM  Staffing Anesthesiologist: Lowella CurbMiller, Warren Ray, MD Performed: anesthesiologist   Preanesthetic Checklist Completed: patient identified, site marked, surgical consent, pre-op evaluation, timeout performed, IV checked, risks and benefits discussed and monitors and equipment checked  Epidural Patient position: sitting Prep: ChloraPrep Patient monitoring: heart rate, cardiac monitor, continuous pulse ox and blood pressure Approach: midline Location: L2-L3 Injection technique: LOR saline  Needle:  Needle type: Tuohy  Needle gauge: 17 G Needle length: 9 cm Needle insertion depth: 4 cm Catheter type: closed end flexible Catheter size: 20 Guage Catheter at skin depth: 8 cm Test dose: negative  Assessment Events: blood not aspirated, injection not painful, no injection resistance, negative IV test and no paresthesia  Additional Notes Reason for block:procedure for pain

## 2018-01-23 NOTE — Anesthesia Pain Management Evaluation Note (Signed)
  [facility] Pain Management Visit Note  Patient: Heather Pena, 37 y.o., female  "Hello I am a member of the anesthesia team at [facility]. We have an anesthesia team available at all times to provide care throughout the hospital, including epidural management and anesthesia for C-section. I don't know your plan for the delivery whether it a natural birth, water birth, IV sedation, nitrous supplementation, doula or epidural, but we want to meet your pain goals."   1.Was your pain managed to your expectations on prior hospitalizations?   No prior hospitalizations  2.What is your expectation for pain management during this hospitalization?     Labor support without medications, Epidural, IV pain meds and Nitrous Oxide  3.How can we help you reach that goal? Pt open to discussion of all methods of pain management. Questions answered.  Record the patient's initial score and the patient's pain goal.   Pain: 2  Pain Goal: 10 [facility] wants you to be able to say your pain was always managed very well.  Heather Pena 01/23/2018

## 2018-01-23 NOTE — Anesthesia Preprocedure Evaluation (Signed)
Anesthesia Evaluation  Patient identified by MRN, date of birth, ID band Patient awake    Reviewed: Allergy & Precautions, NPO status , Patient's Chart, lab work & pertinent test results  Airway Mallampati: II  TM Distance: >3 FB Neck ROM: Full    Dental no notable dental hx.    Pulmonary neg pulmonary ROS, asthma ,    Pulmonary exam normal breath sounds clear to auscultation       Cardiovascular negative cardio ROS Normal cardiovascular exam Rhythm:Regular Rate:Normal     Neuro/Psych negative neurological ROS  negative psych ROS   GI/Hepatic negative GI ROS, Neg liver ROS,   Endo/Other  negative endocrine ROSHypothyroidism   Renal/GU negative Renal ROS  negative genitourinary   Musculoskeletal negative musculoskeletal ROS (+)   Abdominal   Peds negative pediatric ROS (+)  Hematology negative hematology ROS (+)   Anesthesia Other Findings   Reproductive/Obstetrics negative OB ROS (+) Pregnancy                             Anesthesia Physical Anesthesia Plan  ASA: II  Anesthesia Plan: Epidural   Post-op Pain Management:    Induction:   PONV Risk Score and Plan:   Airway Management Planned:   Additional Equipment:   Intra-op Plan:   Post-operative Plan:   Informed Consent:   Plan Discussed with:   Anesthesia Plan Comments:         Anesthesia Quick Evaluation

## 2018-01-23 NOTE — H&P (Signed)
37 y.o. 39 6/7  G1P0 comes in for AMA term induction.  Otherwise has good fetal movement and no bleeding.  Past Medical History:  Diagnosis Date  . AMA (advanced maternal age) primigravida 35+   . Anemia   . Asthma   . Hashimoto's disease   . Headache   . Herpes   . History of endometriosis   . Hypothyroidism   . Newborn product of in vitro fertilization (IVF) pregnancy   . PONV (postoperative nausea and vomiting)     Past Surgical History:  Procedure Laterality Date  . CHROMOPERTUBATION N/A 04/20/2015   Procedure: CHROMOPERTUBATION;  Surgeon: Osborn CohoAngela Roberts, MD;  Location: [facility];  Service: Gynecology;  Laterality: N/A;  . COLONOSCOPY  2012  . DIAGNOSTIC LAPAROSCOPY     2012, and 2009  . DILATATION & CURRETTAGE/HYSTEROSCOPY WITH RESECTOCOPE  04/20/2015   Procedure: DILATATION & CURETTAGE/HYSTEROSCOPY WITH RESECTOCOPE;  Surgeon: Osborn CohoAngela Roberts, MD;  Location: [facility];  Service: Gynecology;;  . ENDOMETRIAL ABLATION  2009   Dr Seymour BarsLavoie: Da Vinci-assisted lysis of adhesions for conservative  . FINGER FRACTURE SURGERY  2004  . HERNIA REPAIR    . LAPAROSCOPY N/A 04/20/2015   Procedure: LAPAROSCOPY OPERATIVE;  Surgeon: Osborn CohoAngela Roberts, MD;  Location: [facility];  Service: Gynecology;  Laterality: N/A;  . WISDOM TOOTH EXTRACTION Bilateral     OB History  Gravida Para Term Preterm AB Living  1            SAB TAB Ectopic Multiple Live Births               # Outcome Date GA Lbr Len/2nd Weight Sex Delivery Anes PTL Lv  1 Current               Social History   Socioeconomic History  . Marital status: Married    Spouse name: Not on file  . Number of children: Not on file  . Years of education: Not on file  . Highest education level: Not on file  Social Needs  . Financial resource strain: Not on file  . Food insecurity - worry: Not on file  . Food insecurity - inability: Not on file  . Transportation needs - medical: Not on file  . Transportation needs - non-medical: Not on file   Occupational History  . Not on file  Tobacco Use  . Smoking status: Never Smoker  . Smokeless tobacco: Never Used  Substance and Sexual Activity  . Alcohol use: Yes    Comment: social   . Drug use: No  . Sexual activity: Not on file  Other Topics Concern  . Not on file  Social History Narrative  . Not on file   Metronidazole and Sulfonamide derivatives    Prenatal Transfer Tool  Maternal Diabetes: No Genetic Screening: Normal Maternal Ultrasounds/Referrals: Normal Fetal Ultrasounds or other Referrals:  Other: Iron infusions Maternal Substance Abuse:  No Significant Maternal Medications:  Meds include: Other: Iron infusions for iron def, normal labs otherwise and Hgb electrophoresis was normal A2 Significant Maternal Lab Results: Lab values include: Other: anemia hgb 8.9  Other PNC: uncomplicated.    There were no vitals filed for this visit.  Lungs/Cor:  NAD Abdomen:  soft, gravid Ex:  no cords, erythema SVE:  4.5/70/-2, AROM clear FHTs:  120, good STV, NST R Toco:  q occ   A/P   AMA term induction.  GBS neg.  Shakena Callari A

## 2018-01-23 NOTE — Progress Notes (Signed)
Per MD turn down pitocin to and titrate back up.

## 2018-01-24 ENCOUNTER — Encounter (HOSPITAL_COMMUNITY): Payer: Self-pay | Admitting: Anesthesiology

## 2018-01-24 ENCOUNTER — Inpatient Hospital Stay (HOSPITAL_COMMUNITY): Payer: BLUE CROSS/BLUE SHIELD

## 2018-01-24 ENCOUNTER — Encounter (HOSPITAL_COMMUNITY): Payer: Self-pay

## 2018-01-24 ENCOUNTER — Encounter (HOSPITAL_COMMUNITY)
Admission: RE | Disposition: A | Discharge status: Skilled Nursing Facility | Payer: Self-pay | Source: Ambulatory Visit | Attending: Pulmonary Disease

## 2018-01-24 DIAGNOSIS — R58 Hemorrhage, not elsewhere classified: Secondary | ICD-10-CM | POA: Diagnosis present

## 2018-01-24 LAB — PROTIME-INR
INR: 1.6
INR: 1.5
PROTHROMBIN TIME: 18 s — AB (ref 11.4–15.2)
Prothrombin Time: 18.9 seconds — ABNORMAL HIGH (ref 11.4–15.2)

## 2018-01-24 LAB — CBC
HCT: 26 % — ABNORMAL LOW (ref 36.0–46.0)
HCT: 20.5 % — ABNORMAL LOW (ref 36.0–46.0)
HEMOGLOBIN: 9.1 g/dL — AB (ref 12.0–15.0)
Hemoglobin: 7.1 g/dL — ABNORMAL LOW (ref 12.0–15.0)
MCH: 31.8 pg (ref 26.0–34.0)
MCH: 31.3 pg (ref 26.0–34.0)
MCHC: 35 g/dL (ref 30.0–36.0)
MCHC: 34.6 g/dL (ref 30.0–36.0)
MCV: 90.9 fL (ref 78.0–100.0)
MCV: 90.3 fL (ref 78.0–100.0)
PLATELETS: 37 10*3/uL — AB (ref 150–400)
Platelets: 35 K/uL — ABNORMAL LOW (ref 150–400)
RBC: 2.86 MIL/uL — AB (ref 3.87–5.11)
RBC: 2.27 MIL/uL — ABNORMAL LOW (ref 3.87–5.11)
RDW: 14.1 % (ref 11.5–15.5)
RDW: 13.9 % (ref 11.5–15.5)
WBC: 9.9 10*3/uL (ref 4.0–10.5)
WBC: 8.5 K/uL (ref 4.0–10.5)

## 2018-01-24 LAB — COMPREHENSIVE METABOLIC PANEL
ALT: 9 U/L — ABNORMAL LOW (ref 14–54)
ANION GAP: 8 (ref 5–15)
AST: 19 U/L (ref 15–41)
Albumin: 2.1 g/dL — ABNORMAL LOW (ref 3.5–5.0)
Alkaline Phosphatase: 34 U/L — ABNORMAL LOW (ref 38–126)
BILIRUBIN TOTAL: 1.6 mg/dL — AB (ref 0.3–1.2)
BUN: 5 mg/dL — ABNORMAL LOW (ref 6–20)
CHLORIDE: 108 mmol/L (ref 101–111)
CO2: 20 mmol/L — ABNORMAL LOW (ref 22–32)
Calcium: 6.7 mg/dL — ABNORMAL LOW (ref 8.9–10.3)
Creatinine, Ser: 0.62 mg/dL (ref 0.44–1.00)
GFR calc Af Amer: >60 mL/min (ref >60)
Glucose, Bld: 159 mg/dL — ABNORMAL HIGH (ref 65–99)
POTASSIUM: 3.3 mmol/L — AB (ref 3.5–5.1)
Sodium: 136 mmol/L (ref 135–145)
TOTAL PROTEIN: 3.5 g/dL — AB (ref 6.5–8.1)

## 2018-01-24 LAB — GLUCOSE, CAPILLARY: Glucose-Capillary: 146 mg/dL — ABNORMAL HIGH (ref 65–99)

## 2018-01-24 LAB — BLOOD GAS, ARTERIAL
Acid-base deficit: 3.6 mmol/L — ABNORMAL HIGH (ref 0.0–2.0)
Bicarbonate: 19 mmol/L — ABNORMAL LOW (ref 20.0–28.0)
DRAWN BY: 14770
FIO2: 100
LHR: 14 {breaths}/min
O2 Saturation: 99.1 %
PEEP/CPAP: 0 cmH2O
PO2 ART: 479 mmHg — AB (ref 83.0–108.0)
VT: 600 mL
pCO2 arterial: 24.6 mmHg — ABNORMAL LOW (ref 32.0–48.0)
pH, Arterial: 7.5 — ABNORMAL HIGH (ref 7.350–7.450)

## 2018-01-24 LAB — DIC (DISSEMINATED INTRAVASCULAR COAGULATION) PANEL
PLATELETS: 155 10*3/uL (ref 150–400)
SMEAR REVIEW: NONE SEEN

## 2018-01-24 LAB — PREPARE RBC (CROSSMATCH)

## 2018-01-24 LAB — FIBRINOGEN: FIBRINOGEN: 140 mg/dL — AB (ref 210–475)

## 2018-01-24 LAB — HEMOGLOBIN AND HEMATOCRIT, BLOOD
HCT: 20.5 % — ABNORMAL LOW (ref 36.0–46.0)
HEMOGLOBIN: 7 g/dL — AB (ref 12.0–15.0)

## 2018-01-24 LAB — POCT I-STAT 3, ART BLOOD GAS (G3+)
Acid-base deficit: 3 mmol/L — ABNORMAL HIGH (ref 0.0–2.0)
Bicarbonate: 22.4 mmol/L (ref 20.0–28.0)
O2 Saturation: 100 %
PCO2 ART: 39.8 mmHg (ref 32.0–48.0)
TCO2: 24 mmol/L (ref 22–32)
pH, Arterial: 7.357 (ref 7.350–7.450)
pO2, Arterial: 247 mmHg — ABNORMAL HIGH (ref 83.0–108.0)

## 2018-01-24 LAB — MRSA PCR SCREENING: MRSA BY PCR: NEGATIVE

## 2018-01-24 LAB — T4, FREE: Free T4: 0.82 ng/dL (ref 0.61–1.12)

## 2018-01-24 LAB — CALCIUM, IONIZED: Calcium, Ionized, Serum: 0.95 mg/dL

## 2018-01-24 LAB — APTT
aPTT: 41 seconds — ABNORMAL HIGH (ref 24–36)
aPTT: 39 s — ABNORMAL HIGH (ref 24–36)

## 2018-01-24 LAB — DIC (DISSEMINATED INTRAVASCULAR COAGULATION)PANEL
D-Dimer, Quant: 4.85 ug/mL-FEU — ABNORMAL HIGH (ref 0.00–0.50)
Fibrinogen: 247 mg/dL (ref 210–475)
INR: 1.31
Prothrombin Time: 16.1 seconds — ABNORMAL HIGH (ref 11.4–15.2)
aPTT: 33 seconds (ref 24–36)

## 2018-01-24 LAB — MASSIVE TRANSFUSION PROTOCOL ORDER (BLOOD BANK NOTIFICATION)

## 2018-01-24 LAB — MAGNESIUM: MAGNESIUM: 1.1 mg/dL — AB (ref 1.7–2.4)

## 2018-01-24 LAB — PHOSPHORUS: Phosphorus: 2.1 mg/dL — ABNORMAL LOW (ref 2.5–4.6)

## 2018-01-24 LAB — LACTIC ACID, PLASMA: LACTIC ACID, VENOUS: 1.3 mmol/L (ref 0.5–1.9)

## 2018-01-24 LAB — TSH: TSH: 1.125 u[IU]/mL (ref 0.350–4.500)

## 2018-01-24 LAB — SAVE SMEAR

## 2018-01-24 LAB — TRIGLYCERIDES: Triglycerides: 152 mg/dL — ABNORMAL HIGH (ref <150)

## 2018-01-24 SURGERY — Surgical Case
Anesthesia: Epidural

## 2018-01-24 MED ORDER — FENTANYL CITRATE (PF) 100 MCG/2ML IJ SOLN
INTRAMUSCULAR | Status: DC | PRN
  Administered 2018-01-24 (×4): 50 ug via INTRAVENOUS

## 2018-01-24 MED ORDER — DEXAMETHASONE SODIUM PHOSPHATE 4 MG/ML IJ SOLN
INTRAMUSCULAR | Status: DC | PRN
  Administered 2018-01-24: 4 mg via INTRAVENOUS

## 2018-01-24 MED ORDER — SODIUM CHLORIDE 0.9 % IV SOLN
INTRAVENOUS | Status: DC | PRN
  Administered 2018-01-24: 08:00:00 via INTRAVENOUS

## 2018-01-24 MED ORDER — FENTANYL CITRATE (PF) 100 MCG/2ML IJ SOLN
25.0000 ug | INTRAMUSCULAR | Status: DC | PRN
  Administered 2018-01-24 (×2): 100 ug via INTRAVENOUS
  Administered 2018-01-24 (×2): 50 ug via INTRAVENOUS
  Administered 2018-01-24 – 2018-01-25 (×3): 100 ug via INTRAVENOUS
  Administered 2018-01-25 (×2): 50 ug via INTRAVENOUS
  Administered 2018-01-25: 100 ug via INTRAVENOUS
  Administered 2018-01-25 – 2018-01-26 (×3): 50 ug via INTRAVENOUS
  Administered 2018-01-26: 100 ug via INTRAVENOUS
  Administered 2018-01-26: 50 ug via INTRAVENOUS
  Administered 2018-01-27 (×4): 100 ug via INTRAVENOUS
  Administered 2018-01-27: 75 ug via INTRAVENOUS
  Filled 2018-01-24 (×10): qty 2

## 2018-01-24 MED ORDER — LACTATED RINGERS IV SOLN
INTRAVENOUS | Status: DC | PRN
  Administered 2018-01-24: 07:00:00 via INTRAVENOUS

## 2018-01-24 MED ORDER — CHLORHEXIDINE GLUCONATE 0.12% ORAL RINSE (MEDLINE KIT)
15.0000 mL | Freq: Two times a day (BID) | OROMUCOSAL | Status: DC
  Administered 2018-01-24 – 2018-01-27 (×6): 15 mL via OROMUCOSAL

## 2018-01-24 MED ORDER — CEFAZOLIN SODIUM-DEXTROSE 2-4 GM/100ML-% IV SOLN
2.0000 g | INTRAVENOUS | Status: AC
  Administered 2018-01-24: 2 g via INTRAVENOUS

## 2018-01-24 MED ORDER — ORAL CARE MOUTH RINSE
15.0000 mL | OROMUCOSAL | Status: DC
  Administered 2018-01-24 – 2018-01-26 (×20): 15 mL via OROMUCOSAL

## 2018-01-24 MED ORDER — FENTANYL CITRATE (PF) 100 MCG/2ML IJ SOLN
INTRAMUSCULAR | Status: AC
  Filled 2018-01-24: qty 2

## 2018-01-24 MED ORDER — KCL-LACTATED RINGERS-D5W 20 MEQ/L IV SOLN
INTRAVENOUS | Status: DC
  Administered 2018-01-24 – 2018-01-26 (×5): via INTRAVENOUS
  Filled 2018-01-24 (×6): qty 1000

## 2018-01-24 MED ORDER — FAMOTIDINE 20 MG IN NS 100 ML IVPB
20.0000 mg | Freq: Two times a day (BID) | INTRAVENOUS | Status: DC
  Administered 2018-01-24 – 2018-01-26 (×6): 20 mg via INTRAVENOUS
  Filled 2018-01-24 (×8): qty 100

## 2018-01-24 MED ORDER — MIDAZOLAM HCL 5 MG/5ML IJ SOLN
INTRAMUSCULAR | Status: DC | PRN
  Administered 2018-01-24 (×4): 0.5 mg via INTRAVENOUS

## 2018-01-24 MED ORDER — MAGNESIUM SULFATE 4 GM/100ML IV SOLN
4.0000 g | Freq: Once | INTRAVENOUS | Status: AC
  Administered 2018-01-24: 4 g via INTRAVENOUS
  Filled 2018-01-24: qty 100

## 2018-01-24 MED ORDER — FENTANYL CITRATE (PF) 100 MCG/2ML IJ SOLN: 25.0000 ug | INTRAMUSCULAR | Status: DC | PRN

## 2018-01-24 MED ORDER — TRANEXAMIC ACID 1000 MG/10ML IV SOLN
1000.0000 mg | INTRAVENOUS | Status: AC
  Administered 2018-01-24: 1000 mg via INTRAVENOUS
  Filled 2018-01-24: qty 10

## 2018-01-24 MED ORDER — CEFAZOLIN (ANCEF) 1 G IV SOLR: 2.0000 g | INTRAVENOUS | Status: DC

## 2018-01-24 MED ORDER — SCOPOLAMINE 1 MG/3DAYS TD PT72
MEDICATED_PATCH | TRANSDERMAL | Status: DC | PRN
  Administered 2018-01-24: 1 via TRANSDERMAL

## 2018-01-24 MED ORDER — METOCLOPRAMIDE HCL 5 MG/ML IJ SOLN: 10.0000 mg | Freq: Once | INTRAMUSCULAR | Status: DC | PRN

## 2018-01-24 MED ORDER — ALBUMIN HUMAN 5 % IV SOLN
INTRAVENOUS | Status: AC
  Filled 2018-01-24: qty 500

## 2018-01-24 MED ORDER — SODIUM BICARBONATE 8.4 % IV SOLN
INTRAVENOUS | Status: DC | PRN
  Administered 2018-01-24: 5 mL via EPIDURAL
  Administered 2018-01-24: 10 mL via EPIDURAL

## 2018-01-24 MED ORDER — PROPOFOL 1000 MG/100ML IV EMUL
5.0000 ug/kg/min | INTRAVENOUS | Status: DC
  Administered 2018-01-24: 20 ug/kg/min via INTRAVENOUS
  Filled 2018-01-24: qty 100

## 2018-01-24 MED ORDER — SODIUM CHLORIDE 0.9 % IV SOLN
Freq: Once | INTRAVENOUS | Status: AC
  Administered 2018-01-24: 20:00:00 via INTRAVENOUS

## 2018-01-24 MED ORDER — EPHEDRINE SULFATE 50 MG/ML IJ SOLN
INTRAMUSCULAR | Status: DC | PRN
  Administered 2018-01-24: 10 mg via INTRAVENOUS
  Administered 2018-01-24: 20 mg via INTRAVENOUS

## 2018-01-24 MED ORDER — SODIUM CHLORIDE 0.9 % IV SOLN
Freq: Once | INTRAVENOUS | Status: AC
  Administered 2018-01-24: 14:00:00 via INTRAVENOUS

## 2018-01-24 MED ORDER — ROCURONIUM BROMIDE 100 MG/10ML IV SOLN
INTRAVENOUS | Status: DC | PRN
  Administered 2018-01-24: 25 mg via INTRAVENOUS
  Administered 2018-01-24: 50 mg via INTRAVENOUS
  Administered 2018-01-24: 25 mg via INTRAVENOUS

## 2018-01-24 MED ORDER — OXYTOCIN 10 UNIT/ML IJ SOLN
INTRAMUSCULAR | Status: DC | PRN
  Administered 2018-01-24: 40 [IU] via INTRAVENOUS

## 2018-01-24 MED ORDER — SODIUM CHLORIDE 0.9 % IV SOLN
250.0000 mL | INTRAVENOUS | Status: DC | PRN
Start: 2018-01-24 — End: 2018-01-27

## 2018-01-24 MED ORDER — MIDAZOLAM HCL 2 MG/2ML IJ SOLN
INTRAMUSCULAR | Status: AC
  Filled 2018-01-24: qty 2

## 2018-01-24 MED ORDER — MEPERIDINE HCL 25 MG/ML IJ SOLN
INTRAMUSCULAR | Status: AC
  Filled 2018-01-24: qty 1

## 2018-01-24 MED ORDER — FENTANYL 2500MCG IN NS 250ML (10MCG/ML) PREMIX INFUSION
0.0000 ug/h | INTRAVENOUS | Status: DC
  Administered 2018-01-24: 25 ug/h via INTRAVENOUS
  Administered 2018-01-25: 70 ug/h via INTRAVENOUS
  Filled 2018-01-24 (×2): qty 250

## 2018-01-24 MED ORDER — SODIUM CHLORIDE 0.9 % IJ SOLN
INTRAMUSCULAR | Status: AC
  Filled 2018-01-24: qty 40

## 2018-01-24 MED ORDER — PHENYLEPHRINE 40 MCG/ML (10ML) SYRINGE FOR IV PUSH (FOR BLOOD PRESSURE SUPPORT)
PREFILLED_SYRINGE | INTRAVENOUS | Status: AC
  Filled 2018-01-24: qty 10

## 2018-01-24 MED ORDER — PHENYLEPHRINE 8 MG IN D5W 100 ML (0.08MG/ML) PREMIX OPTIME
INJECTION | INTRAVENOUS | Status: AC
  Filled 2018-01-24: qty 100

## 2018-01-24 MED ORDER — ETOMIDATE 2 MG/ML IV SOLN
INTRAVENOUS | Status: AC
  Filled 2018-01-24: qty 10

## 2018-01-24 MED ORDER — CEFAZOLIN SODIUM-DEXTROSE 1-4 GM/50ML-% IV SOLN
1.0000 g | INTRAVENOUS | Status: DC
  Filled 2018-01-24: qty 50

## 2018-01-24 MED ORDER — SUCCINYLCHOLINE CHLORIDE 20 MG/ML IJ SOLN
INTRAMUSCULAR | Status: DC | PRN
  Administered 2018-01-24: 140 mg via INTRAVENOUS

## 2018-01-24 MED ORDER — ROCURONIUM BROMIDE 100 MG/10ML IV SOLN
INTRAVENOUS | Status: AC
  Filled 2018-01-24: qty 1

## 2018-01-24 MED ORDER — ONDANSETRON HCL 4 MG/2ML IJ SOLN
INTRAMUSCULAR | Status: DC | PRN
  Administered 2018-01-24: 4 mg via INTRAVENOUS

## 2018-01-24 MED ORDER — PROPOFOL 1000 MG/100ML IV EMUL
5.0000 ug/kg/min | INTRAVENOUS | Status: DC
  Administered 2018-01-24: 40 ug/kg/min via INTRAVENOUS
  Administered 2018-01-24 – 2018-01-25 (×4): 50 ug/kg/min via INTRAVENOUS
  Administered 2018-01-25 (×3): 40 ug/kg/min via INTRAVENOUS
  Filled 2018-01-24 (×10): qty 100

## 2018-01-24 MED ORDER — MEPERIDINE HCL 25 MG/ML IJ SOLN
INTRAMUSCULAR | Status: DC | PRN
  Administered 2018-01-24 (×2): 6.25 mg via INTRAVENOUS

## 2018-01-24 MED ORDER — ETOMIDATE 2 MG/ML IV SOLN
INTRAVENOUS | Status: DC | PRN
  Administered 2018-01-24: 16 mg via INTRAVENOUS

## 2018-01-24 MED ORDER — EPHEDRINE 5 MG/ML INJ
INTRAVENOUS | Status: AC
  Filled 2018-01-24: qty 10

## 2018-01-24 MED ORDER — ALBUMIN HUMAN 5 % IV SOLN
INTRAVENOUS | Status: DC | PRN
  Administered 2018-01-24 (×3): via INTRAVENOUS

## 2018-01-24 MED ORDER — SODIUM CHLORIDE 0.9 % IV SOLN
INTRAVENOUS | Status: DC | PRN
  Administered 2018-01-24: 60 ug/min via INTRAVENOUS

## 2018-01-24 MED ORDER — ACETAMINOPHEN 325 MG PO TABS: 650.0000 mg | ORAL_TABLET | ORAL | Status: DC | PRN

## 2018-01-24 MED ORDER — POTASSIUM CHLORIDE 10 MEQ/100ML IV SOLN
10.0000 meq | INTRAVENOUS | Status: AC
  Administered 2018-01-24 (×4): 10 meq via INTRAVENOUS
  Filled 2018-01-24 (×4): qty 100

## 2018-01-24 SURGICAL SUPPLY — 52 items
AGENT HMST KT MTR STRL THRMB (HEMOSTASIS) ×1
APL SKNCLS STERI-STRIP NONHPOA (GAUZE/BANDAGES/DRESSINGS) ×1
BENZOIN TINCTURE PRP APPL 2/3 (GAUZE/BANDAGES/DRESSINGS) ×3 IMPLANT
CHLORAPREP W/TINT 26ML (MISCELLANEOUS) ×3 IMPLANT
CLAMP CORD UMBIL (MISCELLANEOUS) IMPLANT
CLOSURE STERI STRIP 1/2 X4 (GAUZE/BANDAGES/DRESSINGS) ×2 IMPLANT
CLOTH BEACON ORANGE TIMEOUT ST (SAFETY) ×3 IMPLANT
COUNTER NEEDLE 1200 MAGNETIC (NEEDLE) ×2 IMPLANT
DRSG OPSITE POSTOP 4X10 (GAUZE/BANDAGES/DRESSINGS) ×3 IMPLANT
ELECT REM PT RETURN 9FT ADLT (ELECTROSURGICAL) ×3
ELECTRODE REM PT RTRN 9FT ADLT (ELECTROSURGICAL) ×1 IMPLANT
EXTRACTOR VACUUM BELL STYLE (SUCTIONS) IMPLANT
FLOSEAL 10ML (HEMOSTASIS) ×2 IMPLANT
GAUZE SPONGE 4X4 12PLY STRL LF (GAUZE/BANDAGES/DRESSINGS) ×2 IMPLANT
GLOVE BIO SURGEON STRL SZ7 (GLOVE) ×3 IMPLANT
GLOVE BIOGEL PI IND STRL 7.0 (GLOVE) ×1 IMPLANT
GLOVE BIOGEL PI INDICATOR 7.0 (GLOVE) ×2
GOWN STRL REUS W/TWL LRG LVL3 (GOWN DISPOSABLE) ×6 IMPLANT
HEMOSTAT ARISTA ABSORB 3G PWDR (MISCELLANEOUS) ×4 IMPLANT
KIT ABG SYR 3ML LUER SLIP (SYRINGE) IMPLANT
NDL HYPO 25X5/8 SAFETYGLIDE (NEEDLE) IMPLANT
NEEDLE HYPO 25X5/8 SAFETYGLIDE (NEEDLE) IMPLANT
NS IRRIG 1000ML POUR BTL (IV SOLUTION) ×3 IMPLANT
PACK C SECTION WH (CUSTOM PROCEDURE TRAY) ×3 IMPLANT
PAD ABD 7.5X8 STRL (GAUZE/BANDAGES/DRESSINGS) ×4 IMPLANT
PAD OB MATERNITY 4.3X12.25 (PERSONAL CARE ITEMS) ×3 IMPLANT
PENCIL SMOKE EVAC W/HOLSTER (ELECTROSURGICAL) ×3 IMPLANT
RTRCTR C-SECT PINK 25CM LRG (MISCELLANEOUS) ×3 IMPLANT
SPONGE LAP 18X18 X RAY DECT (DISPOSABLE) ×10 IMPLANT
STRIP CLOSURE SKIN 1/2X4 (GAUZE/BANDAGES/DRESSINGS) ×2 IMPLANT
SURGIFLO W/THROMBIN 8M KIT (HEMOSTASIS) ×2 IMPLANT
SUT MNCRL 0 VIOLET CTX 36 (SUTURE) ×2 IMPLANT
SUT MON AB-0 CT1 36 (SUTURE) ×2 IMPLANT
SUT MONOCRYL 0 CTX 36 (SUTURE) ×8
SUT PLAIN 2 0 (SUTURE) ×3
SUT PLAIN 2 0 XLH (SUTURE) IMPLANT
SUT PLAIN ABS 2-0 CT1 27XMFL (SUTURE) IMPLANT
SUT VIC AB 0 CT1 18XCR BRD8 (SUTURE) IMPLANT
SUT VIC AB 0 CT1 27 (SUTURE) ×15
SUT VIC AB 0 CT1 27XBRD ANBCTR (SUTURE) ×2 IMPLANT
SUT VIC AB 0 CT1 27XCR 8 STRN (SUTURE) IMPLANT
SUT VIC AB 0 CT1 36 (SUTURE) ×4 IMPLANT
SUT VIC AB 0 CT1 8-18 (SUTURE) ×9
SUT VIC AB 2-0 CT1 (SUTURE) ×2 IMPLANT
SUT VIC AB 2-0 CT1 27 (SUTURE) ×3
SUT VIC AB 2-0 CT1 TAPERPNT 27 (SUTURE) ×1 IMPLANT
SUT VIC AB 3-0 SH 27 (SUTURE) ×6
SUT VIC AB 3-0 SH 27X BRD (SUTURE) IMPLANT
SUT VIC AB 4-0 KS 27 (SUTURE) ×3 IMPLANT
SUT VICRYL 0 TIES 12 18 (SUTURE) ×2 IMPLANT
TOWEL OR 17X24 6PK STRL BLUE (TOWEL DISPOSABLE) ×5 IMPLANT
TRAY FOLEY BAG SILVER LF 14FR (SET/KITS/TRAYS/PACK) ×3 IMPLANT

## 2018-01-24 NOTE — Progress Notes (Signed)
Pt has been on pitocin for nearly 24 hours.  The rate has gone as high as 26 and then was cut back twice to encourage good pattern. She has been on the peanut and also sitting straight up.  Pt is tired and frustrated.  She did get an epidural for pain control.   FHTs now 150s, gSTV, NST R, cat 1 Toco getting better again but currently q 5 min SVE 4.5/60(some edema)/-2  There has been no change in nearly 24 hours.  Offered both continuing for a while longer vs. C/S now- pt would like to proceed wth C/S.  All risks, benefits and alternatives were d/w pt.

## 2018-01-24 NOTE — Anesthesia Post-op Follow-up Note (Signed)
  Anesthesia Pain Follow-up Note  Patient: Heather Pena  Day #: 1  Date of Follow-up: 01/24/2018 Time: 8:57 PM  Last Vitals:  Vitals:   01/24/18 2008 01/24/18 2025  BP: 107/68   Pulse: (!) 105 (!) 103  Resp: 18 20  Temp: 36.9 C 37.2 C  SpO2: 100% 100%    Level of Consciousness: sedated, remains intubated  Pain: none   Side Effects:None  Catheter Site Exam:clean     Plan: patient remains coagulopathic with plt 32K, Hb 8.0 following emergent Caesarian with hysterectomy. She is presently receiving platelet transfusion. Epidural catheter remains in place, but is not being used for analgesia. We will remove the catheter when coagulopathy resolves.Marland Kitchen.  Germaine PomfretJACKSON,E. Jakiera Ehler

## 2018-01-24 NOTE — H&P (Signed)
PULMONARY / CRITICAL CARE MEDICINE   Name: Heather Pena MRN: [id number] DOB: [birth year]    ADMISSION DATE:  01/23/2018    REFERRING MD:  Henderson Cloud  CHIEF COMPLAINT: Post-op bleeding in setting of elective Caesarian section  HISTORY OF PRESENT ILLNESS:   The paient is a 37 yo B/F  Primigravida who ubnderwent C section today and dleivered a baby boy. Her operative course was complicated by massive bleeding. She apparently had massively doialted myopmetrial veins and adhesions on the psterior uterine wall. The patient required  6 units of blood and was administered 4 units of FFP as well. In addiotion, she received about 3 additional liters of crystalloid. Her last hb was about 9.1 at 9AM BP is presently is 140/60 with a heart rate of 110-120. The patient is in a sinus tach rhythm. Her last platelet count was 155. Fibrinogen level was ok this AM. The patient was given 1 gm of Tranxemic acid during the procedure. A line was inserted by anaesthesia. I received a call from anaesthesia and requesting a bed in the ICU. The patient is not on any pressors at this time.    PAST MEDICAL HISTORY :  She  has a past medical history of AMA (advanced maternal age) primigravida 35+, Anemia, Asthma, Hashimoto's disease, Headache, Herpes, History of endometriosis, Hypothyroidism, Newborn product of in vitro fertilization (IVF) pregnancy, and PONV (postoperative nausea and vomiting).  PAST SURGICAL HISTORY: She  has a past surgical history that includes Colonoscopy (2012); Endometrial ablation (2009); Finger fracture surgery (2004); Diagnostic laparoscopy; Wisdom tooth extraction (Bilateral); laparoscopy (N/A, 04/20/2015); Chromopertubation (N/A, 04/20/2015); Dilatation & currettage/hysteroscopy with resectoscope (04/20/2015); and Hernia repair.  Allergies  Allergen Reactions  . Metronidazole Nausea And Vomiting  . Sulfonamide Derivatives Hives    No current facility-administered medications on file  prior to encounter.    Current Outpatient Medications on File Prior to Encounter  Medication Sig  . acetaminophen (TYLENOL) 500 MG tablet Take 1,000 mg by mouth every 6 (six) hours as needed for moderate pain or headache.  . polyethylene glycol (MIRALAX / GLYCOLAX) packet Take 17 g by mouth daily.  . Prenatal Vit-Fe Fumarate-FA (PRENATAL MULTIVITAMIN) TABS tablet Take 1 tablet by mouth daily at 12 noon.  . ranitidine (ZANTAC) 150 MG tablet Take 150 mg by mouth 2 (two) times daily.  . valACYclovir (VALTREX) 500 MG tablet Take 500 mg by mouth 2 (two) times daily.  Marland Kitchen albuterol (PROVENTIL HFA;VENTOLIN HFA) 108 (90 BASE) MCG/ACT inhaler Inhale 1-2 puffs into the lungs every 6 (six) hours as needed for wheezing or shortness of breath.     FAMILY HISTORY:  Her indicated that her mother is alive. She indicated that her father is alive. She indicated that the status of her cousin is unknown.   SOCIAL HISTORY: She  reports that  has never smoked. she has never used smokeless tobacco. She reports that she drinks alcohol. She reports that she does not use drugs.  REVIEW OF SYSTEMS:   Not available  SUBJECTIVE:  Not available  VITAL SIGNS: BP 110/76 (BP Location: Right Arm)   Pulse (!) 116   Temp 98.2 F (36.8 C) (Oral)   Resp 18   Ht 5' (1.524 m)   Wt 160 lb 15 oz (73 kg)   LMP 04/16/2017   SpO2 100%   Breastfeeding? Unknown   BMI 31.43 kg/m   HEMODYNAMICS:  Thus far fairly stable. The patient remains a little tacycardic.  Mainatining BP however.  VENTILATOR SETTINGS: Vent Mode:  PRVC FiO2 (%):  [50 %] 50 % Set Rate:  [18 bmp] 18 bmp Vt Set:  [380 mL] 380 mL PEEP:  [5 cmH20] 5 cmH20 Plateau Pressure:  [11 cmH20] 11 cmH20  INTAKE / OUTPUT: I/O last 3 completed shifts: In: 1000 [I.V.:1000] Out: 900 [Urine:900]  PHYSICAL EXAMINATION: General:  Young B/F on ventitlator sedated Neuro:  Obtunded , was apparently moving all extremities HEENT:  [place]/AT , ET tube,  NGT Cardiovascular: RRRs1S@ Lungs:  Bilateral BS Abdomen:  Soft, scar abdomen with large binder, dry Musculoskeletal:  WNL EXTR:: 2-3 + edema to the thighs Skin: Intact  LABS:  BMET No results for input(s): NA, K, CL, CO2, BUN, CREATININE, GLUCOSE in the last 168 hours.  Electrolytes No results for input(s): CALCIUM, MG, PHOS in the last 168 hours.  CBC Recent Labs  Lab 01/23/18 0825 01/24/18 0736 01/24/18 0900  WBC 5.5  --  9.9  HGB 11.8* 7.0* 9.1*  HCT 34.5* 20.5* 26.0*  PLT 219 155 37*    Coag's Recent Labs  Lab 01/24/18 0736 01/24/18 0900  APTT 33 41*  INR 1.31 1.60    Sepsis Markers No results for input(s): LATICACIDVEN, PROCALCITON, O2SATVEN in the last 168 hours.  ABG Recent Labs  Lab 01/24/18 0804 01/24/18 0855  PHART 7.500* 7.441  PCO2ART 24.6* 26.9*  PO2ART 479* 299*    Liver Enzymes No results for input(s): AST, ALT, ALKPHOS, BILITOT, ALBUMIN in the last 168 hours.  Cardiac Enzymes No results for input(s): TROPONINI, PROBNP in the last 168 hours.  Glucose No results for input(s): GLUCAP in the last 168 hours.  Imaging No results found.      SIGNIFICANT EVENTS: As described above in the hsitory  LINES/TUBES: ET, NGT, foley 2/16  DISCUSSION:    ASSESSMENT / PLAN:  PULMONARY  The patient  remains intubated and willl likely remain so 1-2 days. O2 saturation 100% on 50% FI02 CXR pending RASS goal: -1 to -2  GYN bleeding Was apparently satisfactorily controlled prior to transfer. The patine required a TAH due to the massive bleeding and difficulty controlling  it post C section.  Dr. Henderson CloudHorvath will monitor HB and be available if further bleeding issues surrounding the surgery become apparent.  Hemodynamics  BP stable presently . Patiet a bit tachycardic. As noted will be following up o Hb later in the day. Not pnpressors presently  Hematological We will be checking her HB and platelets and coagulation factors later in the  day.  Patient will not be on anticoagulation. Has been put on Gi prophylaxis      FAMILY       Jamesetta Soobert Makhai Fulco MD Pulmonary and Critical Care Medicine [facility] Pager: [phone]  01/24/2018, 11:18 AM

## 2018-01-24 NOTE — Anesthesia Postprocedure Evaluation (Signed)
Anesthesia Post Note  Patient: Heather Pena  Procedure(s) Performed: CESAREAN SECTION (N/A )     Patient location during evaluation: Other Anesthesia Type: Epidural and General Level of consciousness: awake and alert and oriented Pain management: pain level controlled Vital Signs Assessment: post-procedure vital signs reviewed and stable Respiratory status: respiratory function unstable and patient remains intubated per anesthesia plan Cardiovascular status: blood pressure returned to baseline and stable Postop Assessment: no apparent nausea or vomiting Anesthetic complications: no Comments: Patient developed hemorrhage after delivery of infant. She had dilated myometrial veins and had adhesions of lower posterior uterine wall. Decision to do hysterectomy by surgeon. MTP called. Patient resuscitated during procedure which required 5700ml of crystalloid, 750 ml of albumin 5%, 6 units of PRBC's, 4 units of FFP. Urine output 125ml. EBL from nurses in OR 8190 which I think is a gross overestimation of blood loss. I would say her EBL was more on the order of 4000-454700ml. She did require pressors during the case but once bleeding was controlled and she was volume resuscitated she was weaned off pressors. GETA was induced during the procedure due to her hemodynamic instability. A-line was inserted into her left radial artery for hemodynamic monitoring as well as frequent blood sampling. She was given 1Gm Tranexamic Acid IV during procedure. Her last labs were H/H 9.1/26 , Plt-37k, aPTT-41 PT-18.9 , INR 1.6, Fibrinogen 140. [facility] for further ICU care and monitoring. She was left intubated due to IV volume given and potential airway edema. Last ABG pH-7.44, pCO2- 26.9, pO2-299, HCO3-18, BE -5 on Vol. Ctrl. 540/12/50%.    Last Vitals:  Vitals:   01/24/18 0600 01/24/18 0615  BP: 112/65   Pulse: 98   Resp: 15   Temp:  37.2 C  SpO2:      Last Pain:   Vitals:   01/24/18 0615  TempSrc: Oral  PainSc:    Pain Goal: Patients Stated Pain Goal: 3 (01/23/18 1921)               Taran Hable A.

## 2018-01-24 NOTE — Progress Notes (Signed)
Initial Nutrition Assessment  DOCUMENTATION CODES:   Obesity unspecified  INTERVENTION:  If unable to extubate within 24-48 hours, recommend Vital High Protein at 3010mL/hr, Pro-stat 60mL BID  With propofol calories of 578.16, provides: 1218 calories (119% estimated needs), 81gm protein (89% estimated needs),  200mL free water  NUTRITION DIAGNOSIS:   Inadequate oral intake related to inability to eat as evidenced by NPO status  GOAL:   Provide needs based on ASPEN/SCCM guidelines  MONITOR:   I & O's, Labs, Skin, Weight trends, Vent status  REASON FOR ASSESSMENT:   Ventilator    ASSESSMENT:   The paient is a 37 yo B/F  Primigravida who underwent C section today and delivered a baby boy. Her operative course was complicated by massive bleeding.  patient required  6 units of blood and was administered 4 units of FFP as well. In addition, she received about 3 additional liters of crystalloid.  Patient is currently intubated on ventilator support MV: 6.4 L/min Temp (24hrs), Avg:98.6 F (37 C), Min:97.5 F (36.4 C), Max:100 F (37.8 C) Propofol: 21.9 ml/hr --> 578.16 calories  MAP 76-93  Labs reviewed:  CBG 146 K+ 3.3, Phos 2.1, Mg 1.1 Triglycerides 152  Medications reviewed and include:  Mg 4g IV 10 KCL x4  NUTRITION - FOCUSED PHYSICAL EXAM:    Most Recent Value  Orbital Region  No depletion  Upper Arm Region  No depletion  Thoracic and Lumbar Region  No depletion  Buccal Region  No depletion  Temple Region  No depletion  Clavicle Bone Region  No depletion  Clavicle and Acromion Bone Region  No depletion  Scapular Bone Region  No depletion  Dorsal Hand  No depletion  Patellar Region  No depletion  Anterior Thigh Region  No depletion  Posterior Calf Region  No depletion  Edema (RD Assessment)  Moderate  Hair  Reviewed  Eyes  Reviewed  Mouth  Reviewed  Skin  Reviewed  Nails  Reviewed       Diet Order:  Diet NPO time specified  EDUCATION NEEDS:    Not appropriate for education at this time  Skin:  Skin Assessment: Skin Integrity Issues: Skin Integrity Issues:: Incisions Incisions: To abdomen, perineum  Last BM:  01/24/2018  Height:   Ht Readings from Last 1 Encounters:  01/24/18 5' (1.524 m)    Weight:   Wt Readings from Last 1 Encounters:  01/24/18 160 lb 15 oz (73 kg)    Ideal Body Weight:  45.45 kg  BMI:  Body mass index is 31.43 kg/m.  Estimated Nutritional Needs:   Kcal:  [phone] calories (ABW x11-14)  Protein:  >/= 91 grams  Fluid:  Per MD  Dionne AnoWilliam M. Ahmya Bernick, MS, RD LDN Inpatient Clinical Dietitian Pager [phone]

## 2018-01-24 NOTE — Progress Notes (Signed)
Lab results called to Dr Franchot Erichsenosenblatt. New orders placed. Will implement and continue to monitor.

## 2018-01-24 NOTE — Progress Notes (Signed)
Responded to MTP in O.R. 9. Blood gas drawn at 0800 per Foster,MD , from arterial line. [facility]

## 2018-01-24 NOTE — Anesthesia Procedure Notes (Signed)
Procedure Name: Intubation Date/Time: 01/24/2018 7:54 AM Performed by: Josephine Igo, MD Pre-anesthesia Checklist: Patient identified, Patient being monitored, Timeout performed, Emergency Drugs available and Suction available Patient Re-evaluated:Patient Re-evaluated prior to induction Oxygen Delivery Method: Circle System Utilized Preoxygenation: Pre-oxygenation with 100% oxygen Induction Type: IV induction, Rapid sequence and Cricoid Pressure applied Ventilation: Mask ventilation without difficulty Laryngoscope Size: Mac and 3 Grade View: Grade I Tube type: Oral Tube size: 7.0 mm Number of attempts: 1 Airway Equipment and Method: stylet Placement Confirmation: ETT inserted through vocal cords under direct vision,  positive ETCO2 and breath sounds checked- equal and bilateral Secured at: 21 cm Tube secured with: Tape Dental Injury: Teeth and Oropharynx as per pre-operative assessment

## 2018-01-24 NOTE — Brief Op Note (Signed)
01/24/2018  9:59 AM  PATIENT:  Heather Pena  37 y.o. female  PRE-OPERATIVE DIAGNOSIS:  primary c-section, failed induction of labor  POST-OPERATIVE DIAGNOSIS:  primary c-section, failed induction of labor PPH, uterine atony, uterine serosal adhesions that were bleeding, expanding R broad ligament hematoma  PROCEDURE:  Procedure(s): CESAREAN SECTION (N/A)  SURGEON:  Surgeon(s) and Role:    Carrington Clamp, MD - Primary    Marlow Baars, MD - Assisting    ANESTHESIA:   epidural then general  EBL:  8196 mL   IVF: 4500 ml LR plus 1700 ml NaCl  BLOOD ADMINISTERED:6 units CC PRBC and 4 units FFP, albumin 3 bottles  UOP: 125 ml at least, pink tinged  DRAINS: none   LOCAL MEDICATIONS USED:  OTHER Surgiflo, Flowseal, Arista  SPECIMEN:  Source of Specimen:  uterus, R tube, portion of L tube, adhesions  DISPOSITION OF SPECIMEN:  PATHOLOGY  COUNTS:  YES  TOURNIQUET:  * No tourniquets in log *  DICTATION: .Note written in EPIC  PLAN OF CARE: Admit to inpatient   PATIENT DISPOSITION:  ICU - intubated and hemodynamically stable.   Delay start of Pharmacological VTE agent (>24hrs) due to surgical blood loss or risk of bleeding: yes  Complications:  Dense adhesions of uterus that were bleeding; atypical vessels on fundus that were bleeding; uterine atony; expanding hematoma on R broad ligament.    Medications:  Ancef, Pitocin, Arista, Flowseal, Surgiflo.  See above for blood products.    Findings:  Baby female, Apgars 10,10; weight P.   The placenta was removed manually after cord tore while trying to deliver.  Uterus was boggy and covered with adhesions with friable vessels that were bleeding actively.  There was a large thick adhesion that was densely adhesed to bowel, L tube, ovary and uterus. The R ovary was not able to be identified but left in situ.  The L ovary was identified and left in situ.  The bladder was able to be retracted inferiorly and remained in tact.   The bowel was able to be retracted off of the posterior uterus intact.  The cul de sac and uterosacral ligaments were able to be identified. The LUS was dilated excessively from before incision of the uterus and never retracted even after closure.   The cervix was left in place and therefore the ureters remained well below the plane of dissection.    Baby was skin to skin with mother after birth in the OR for at least 15 minutes but then was transferred to the [facility] with the father.  Reason for operation:  Pt was admitted for Term induction for AMA and was ruptured yesterday am.  She had been on nearly 24 hours of pitocin and had no cervical change at all during that time.  Pt had been counseled about the risks, benefits and alternatives to C/S and desired to proceed.  Technique:  After adequate epidural anesthesia was achieved, the patient was prepped and draped in usual sterile fashion.  A foley catheter was used to drain the bladder.  A pfannanstiel incision was made with the scalpel and carried down to the fascia with the bovie cautery. The fascia was incised in the midline with the scalpel and carried in a transverse curvilinear manner bilaterally.  The fascia was reflected superiorly and inferiorly off the rectus muscles and the muscles split in the midline.  A bowel free portion of the peritoneum was entered bluntly and then extended in a superior  and inferior manner with good visualization of the bowel and bladder.  At this point it was noted that the serosa of the anterior LUS had been interupted with entry into the peritoneum.  The superior edge of the serosa was identified above the very dilated LUS.  The bladder was identified and retracted below the LUS. The Alexis instrument was then placed and the vesico-uterine fascia tented up and incised in a transverse curvilinear manner.  A 2 cm transverse incision was made in the upper portion of the lower uterine segment until the amnion was exposed.    The incision was extended transversely in a blunt manner.  Cloudy fluid was noted and the baby delivered in the vertex presentation without complication.  The baby was bulb suctioned and the cord was clamped and cut after 30 seconds and stripping blood from cord into baby.  The baby was then handed to awaiting Neonatology.  The placenta was then delivered manually after trying to remove via the cord and the uterus cleared of all debris.  The uterine incision was then closed with a running lock stitch of 0 monocryl.  An imbricating layer of 0 monocryl was closed as well. Excellent hemostasis of the uterine incision was achieved and the abdomen was cleared with irrigation.    The uterus however remained very boggy even after closure of the uterine incision.  An exploration of the uterus and adnexa revealed the thick adhesions.  The ovaries were not able to be identified.  The uterus had an oozing serosal surface anteriorly.  The uterus was then removed through the alexis and the abberrent vessels on the fundus were identied and were bleeding.  The uterus itself was bleeding on the right side near where the cornua was and there was an expanding hematoma in the broad ligament on the right side.  Dr. Chestine Spore had been called and scubbed in to consult.  She agreed that the best course of action was to do a hystorectomy as the bleeding was from multiple sites and not from uterine atony alone.  I had before the patient got any medications and before general anesthesia consented the patient for a hysterectomy if needed to stop bleeding and maintain her life.  The alexis was removed and the balfor retractor with extender was placed.  The posterior adhesion with the L tube and bowel was sharply and bluntly dissected off the uterus with kellys and free ties.  The posterior thick adhesion was then taken down in midline in the same way and thus the posterior cul de sac was identified.  A portion of this adhesion above the bowel  and L tube was removed and sent to pathology.  Further adhesions of the side wall peritoneum to the tubes and ovaries were taken down with succesive bites of the kelly clamp and either free tied or suture ligated.  On the R side the ovary was able to be identified and a thin portion of the broad ligament was identified and entered.  The bladder was retracted further inferiorly with sharp and blunt dissection.  A heiney clamp was placed on both sides and the uterine ovarian ligament and round ligament were divided in two bites, each pedicle being secured with a stitch of 0-vicryl and a tie on a pass. The broad ligament close to the uterus was then divided and incised in the same way until just below the LUS.  The same procedure was performed on the L side although the ovary was not actually  seen.  Then the uterine arteries were secured in the same way, right up against the cervix just to the mid cervix.  A clamp was placed transversely on this upper portion of the cervix and the cervix entered into in two bites.  A clamp was placed on the L side and the cervix entered in there as well.  In external os of the cervix was identified about 2 cm below this incision and was left intact.  The uterus was removed with jorgensens scissors and handed off the field to pathology.  The cervix was then closed using a baseball stitch to evert the edges under and ensure hemostasis.    At this point we irrigated the pelvis and peritoneal cavity and noted oozing deep in the cul de sac.  Surgiflo was placed and pressure of the cervix to the cul de sac was used to help hemostasis.  A pedicle on the L side close to the uterine ovarian ligament was secured with two stitches of 0 vicryl.  Several areas of bleeding on the outside of the cervix were taken care of with cautery.  A second application, this time with Floseal was placed over all pedicles and again in the posterior cul de sac.  Anteriorly two vials of Arista were placed on all  cut surfaces and the anterior cervix and vagina.  While we watched for any additional welling up or frank bleeding, we did a second time out to review the surgical procedures and the MTP products, meds and fluids.  It was reported at that point that her H/H was 5 at one point and her platelets were currently 30.  We looked again and although there were small areas with slight oozing, hemostasis was achieved.  The retractor and laps were removed.  The peritoneum was closed with a running stitch of 2-0 vicryl.  This incorporated the rectus muscles as a separate layer for just two additional stitches. The fascia was then closed with a running stitch of 0 vicryl.  The subcutaneous layer was closed with interrupted  stitches of 2-0 plain gut.  The skin was closed with 4-0 vicryl on a Keith needle and steri-strips.  The patient tolerated the procedure and recusitationwell.  She [facility] and stable via [facility] in order to better monitor her recovery.    All counts were correct times three.  Angelika Jerrett A

## 2018-01-24 NOTE — Transfer of Care (Addendum)
Immediate Anesthesia Transfer of Care Note  Patient: Ermalinda BarriosQuanesha V Salton  Procedure(s) Performed: CESAREAN SECTION (N/A )  Patient Location: ICU  Anesthesia Type:General  Level of Consciousness: sedated, unresponsive and Patient remains intubated per anesthesia plan  Airway & Oxygen Therapy: Patient placed on Ventilator (see vital sign flow sheet for setting)  Post-op Assessment: Report given to RN and Post -op Vital signs reviewed and stable  Post vital signs: Reviewed and stable  Last Vitals:  Vitals:   01/24/18 0600 01/24/18 0615  BP: 112/65   Pulse: 98   Resp: 15   Temp:  37.2 C  SpO2:      Last Pain:  Vitals:   01/24/18 0615  TempSrc: Oral  PainSc:       Patients Stated Pain Goal: 3 (01/23/18 1921)  Complications: No apparent anesthesia complications and unexpected post-op hospitalization

## 2018-01-24 NOTE — Op Note (Signed)
01/24/2018  9:59 AM  PATIENT:  Heather BarriosQuanesha V Pena  37 y.o. female  PRE-OPERATIVE DIAGNOSIS:  primary c-section, failed induction of labor  POST-OPERATIVE DIAGNOSIS:  primary c-section, failed induction of labor PPH, uterine atony, uterine serosal adhesions that were bleeding, expanding R broad ligament hematoma  PROCEDURE:  Procedure(s): CESAREAN SECTION (N/A), supra cervical hysterectomy, L salpingectomy, lysis of adhesions  SURGEON:  Surgeon(s) and Role:    Carrington Clamp* Rissa Turley, MD - Primary    Marlow Baars* Clark, Dyanna, MD - Assisting    ANESTHESIA:   epidural then general  EBL:  8196 mL   IVF: 4500 ml LR plus 1700 ml NaCl  BLOOD ADMINISTERED:6 units CC PRBC and 4 units FFP, albumin 3 bottles  UOP: 125 ml at least, pink tinged  DRAINS: none   LOCAL MEDICATIONS USED:  OTHER Surgiflo, Flowseal, Arista  SPECIMEN:  Source of Specimen:  uterus, R tube, portion of L tube, adhesions  DISPOSITION OF SPECIMEN:  PATHOLOGY  COUNTS:  YES  TOURNIQUET:  * No tourniquets in log *  DICTATION: .Note written in EPIC  PLAN OF CARE: Admit to inpatient   PATIENT DISPOSITION:  ICU - intubated and hemodynamically stable.   Delay start of Pharmacological VTE agent (>24hrs) due to surgical blood loss or risk of bleeding: yes  Complications:  Dense adhesions of uterus that were bleeding; atypical vessels on fundus that were bleeding; uterine atony; expanding hematoma on R broad ligament.    Medications:  Ancef, Pitocin, Arista, Flowseal, Surgiflo.  See above for blood products.    Findings:  Baby female, Apgars 10,10; weight P.   The placenta was removed manually after cord tore while trying to deliver.  Uterus was boggy and covered with adhesions with friable vessels that were bleeding actively.  There was a large thick adhesion that was densely adhesed to bowel, L tube, ovary and uterus. The R ovary was not able to be identified but left in situ.  The L ovary was identified and left in situ.  The  bladder was able to be retracted inferiorly and remained in tact.  The bowel was able to be retracted off of the posterior uterus intact.  The cul de sac and uterosacral ligaments were able to be identified. The LUS was dilated excessively from before incision of the uterus and never retracted even after closure.   The cervix was left in place and therefore the ureters remained well below the plane of dissection.    Baby was skin to skin with mother after birth in the OR for at least 15 minutes but [facility]  Reason for operation:  Pt was admitted for Term induction for AMA and was ruptured yesterday am.  She had been on nearly 24 hours of pitocin and had no cervical change at all during that time.  Pt had been counseled about the risks, benefits and alternatives to C/S and desired to proceed.  Technique:  After adequate epidural anesthesia was achieved, the patient was prepped and draped in usual sterile fashion.  A foley catheter was used to drain the bladder.  A pfannanstiel incision was made with the scalpel and carried down to the fascia with the bovie cautery. The fascia was incised in the midline with the scalpel and carried in a transverse curvilinear manner bilaterally.  The fascia was reflected superiorly and inferiorly off the rectus muscles and the muscles split in the midline.  A bowel free portion of the peritoneum was  entered bluntly and then extended in a superior and inferior manner with good visualization of the bowel and bladder.  At this point it was noted that the serosa of the anterior LUS had been interupted with entry into the peritoneum.  The superior edge of the serosa was identified above the very dilated LUS.  The bladder was identified and retracted below the LUS. The Alexis instrument was then placed and the vesico-uterine fascia tented up and incised in a transverse curvilinear manner.  A 2 cm transverse incision was made in the upper  portion of the lower uterine segment until the amnion was exposed.   The incision was extended transversely in a blunt manner.  Cloudy fluid was noted and the baby delivered in the vertex presentation without complication.  The baby was bulb suctioned and the cord was clamped and cut after 30 seconds and stripping blood from cord into baby.  The baby was then handed to awaiting Neonatology.  The placenta was then delivered manually after trying to remove it with tension on  the cord and the uterus cleared of all debris.  The uterine incision was then closed with a running lock stitch of 0 monocryl.  An imbricating layer of 0 monocryl was closed as well. Excellent hemostasis of the uterine incision was achieved and the abdomen was cleared with irrigation.    The uterus however remained very boggy even after closure of the uterine incision.  An exploration of the uterus and adnexa revealed the thick adhesions.  The ovaries were not able to be identified.  The uterus had an oozing serosal surface anteriorly.  The uterus was then removed through the alexis and the abberrent vessels on the fundus were identied and were bleeding.  The uterus itself was bleeding on the right side near where the cornua was and there was an expanding hematoma in the broad ligament on the right side.  Dr. Chestine Spore had been called and scubbed in to consult.  She agreed that the best course of action was to do a hystorectomy as the bleeding was from multiple sites and not from uterine atony alone.  I had before the patient got any medications and before general anesthesia consented the her for a hysterectomy if needed to stop bleeding and maintain her life.  The alexis was removed and the balfor retractor with extender was placed.  The posterior adhesion with the L tube and bowel was sharply and bluntly dissected off the uterus with kellys and free ties.  The posterior thick adhesion was then taken down in midline in the same way and thus the  posterior cul de sac was identified.  A portion of this adhesion above the bowel and L tube was removed and sent to pathology.  Further adhesions of the side wall peritoneum to the tubes and ovaries were taken down with succesive bites of the kelly clamp and either free tied or suture ligated.  On the R side the ovary was able to be identified and a thin portion of the broad ligament was identified and entered.  The bladder was retracted further inferiorly with sharp and blunt dissection.  A heiney clamp was placed on both sides and the uterine ovarian ligament and round ligament were divided in two bites, each pedicle being secured with a stitch of 0-vicryl and a tie on a pass. The broad ligament close to the uterus was then divided and incised in the same way until just below the LUS.  The same procedure  was performed on the L side although the ovary was not actually seen.  Then the uterine arteries were secured with alternating successive bites with the straight heiney, right up against the cervix just to the mid cervix.  Each pedicle was secured with a stitch of 0 vicryl. A clamp was placed transversely on this upper portion of the cervix and the cervix entered into in two bites.  A clamp was placed on the L side and the cervix entered in there as well.  The external os of the cervix was identified about 2 cm below this incision and was left intact.  The uterus was removed with jorgensens scissors and handed off the field to pathology.  The cervix was then closed using a baseball stitch to evert the edges under and ensure hemostasis.    At this point we irrigated the pelvis and peritoneal cavity and noted oozing deep in the cul de sac.  Surgiflo was placed and pressure of the cervix to the cul de sac was used to help hemostasis.  A pedicle on the L side close to the uterine ovarian ligament was secured with two stitches of 0 vicryl.  Several areas of bleeding on the outside of the cervix were taken care of  with cautery.  A second application, this time with Floseal was placed over all pedicles and again in the posterior cul de sac.  Anteriorly two vials of Arista were placed on all cut surfaces and the anterior cervix and vagina.  While we watched for any additional welling up or frank bleeding, we did a second time out to review the surgical procedures and the MTP products, meds and fluids.  It was reported at that point that her H/H was 5 at one point and her platelets were currently 30.  We looked again and although there were small areas with slight oozing, hemostasis was achieved.  The retractor and laps were removed.  The peritoneum was closed with a running stitch of 2-0 vicryl.  This incorporated the rectus muscles as a separate layer for just two additional stitches. The fascia was then closed with a running stitch of 0 vicryl.  The subcutaneous layer was closed with interrupted  stitches of 2-0 plain gut.  The skin was closed with 4-0 vicryl on a Keith needle and steri-strips.  The patient tolerated the procedure and recusitationwell.  She was tranferred directly from the OR to [facility] intubated and stable via Care Link in order to better monitor her recovery.    All counts were correct times three.  Takenya Travaglini A

## 2018-01-24 NOTE — Progress Notes (Addendum)
MD would like to cut pitocin to 12 and titrate up.

## 2018-01-24 NOTE — Progress Notes (Signed)
Patient is intubated but responding with head nods.  Pt moving voluntarily.  Pt c/o some abdominal pain.   Vitals:   01/24/18 1619 01/24/18 1630 01/24/18 1700 01/24/18 1730  BP: 123/82 116/73 121/75 119/76  Pulse: (!) 101 (!) 105 (!) 102 (!) 101  Resp: (!) 21 (!) 23 (!) 22 20  Temp: 97.6 F (36.4 C)     TempSrc: Axillary     SpO2: 100% 100% 100% 100%  Weight:      Height:        lungs:   clear to auscultation cor:    RRR Abdomen:  soft, appropriate tenderness, incisions intact and without erythema or exudate.  No active bleeding from incision.  Abd is tender to touch but bowel sounds are excellent.  There is no garding or distension. ex:    no cords - SCDs on Perineum:  Scant blood on pad.  Results for orders placed or performed during the hospital encounter of 01/23/18 (from the past 24 hour(s))  Prepare fresh frozen plasma     Status: None (Preliminary result)   Collection Time: 01/24/18  7:30 AM  Result Value Ref Range   Unit Number [id number]    Blood Component Type THWPLS APHR2    Unit division 00    Status of Unit ISSUED    Transfusion Status OK TO TRANSFUSE    Unit Number [id number]    Blood Component Type THWPLS APHR1    Unit division 00    Status of Unit ISSUED    Transfusion Status OK TO TRANSFUSE    Unit Number [id number]    Blood Component Type THAWED PLASMA    Unit division 00    Status of Unit ISSUED    Transfusion Status OK TO TRANSFUSE    Unit Number [id number]    Blood Component Type THAWED PLASMA    Unit division 00    Status of Unit ISSUED    Transfusion Status OK TO TRANSFUSE    Unit Number [id number]    Blood Component Type THWPLS APHR1    Unit division 00    Status of Unit REL FROM [facility]    Transfusion Status      OK TO TRANSFUSE Performed at [facility], [address], [place], [place] [address]    Unit Number [id number]    Blood Component Type THWPLS APHR2    Unit division 00    Status of Unit REL FROM [facility]    Transfusion Status OK TO TRANSFUSE   Initiate MTP (Blood Bank Notification)     Status: None   Collection Time: 01/24/18  7:35 AM  Result Value Ref Range   Initiate Massive Transfusion Protocol      MTP ACTIVATED 0730 MTP CLEARED 01/24/18 10:30 Performed at [facility], [address], [place], [place] [address]   DIC (disseminated intravasc coag) panel (STAT)     Status: Abnormal   Collection Time: 01/24/18  7:36 AM  Result Value Ref Range   Prothrombin Time 16.1 (H) 11.4 - 15.2 seconds   INR 1.31    aPTT 33 24 - 36 seconds   Fibrinogen 247 210 - 475 mg/dL   D-Dimer, Quant 4.27 (H) 0.00 - 0.50 ug/mL-FEU   Platelets 155 150 - 400 K/uL   Smear Review NO SCHISTOCYTES SEEN   Hemoglobin and hematocrit, blood (STAT)     Status: Abnormal   Collection Time: 01/24/18  7:36 AM  Result Value Ref Range   Hemoglobin 7.0 (L) 12.0 - 15.0 g/dL  HCT 20.5 (L) 36.0 - 46.0 %  Blood gas, arterial     Status: Abnormal   Collection Time: 01/24/18  8:04 AM  Result Value Ref Range   FIO2 100.00    VT 600 mL   LHR 14 resp/min   Peep/cpap 0.0 cm H20   pH, Arterial 7.500 (H) 7.350 - 7.450   pCO2 arterial 24.6 (L) 32.0 - 48.0 mmHg   pO2, Arterial 479 (H) 83.0 - 108.0 mmHg   Bicarbonate 19.0 (L) 20.0 - 28.0 mmol/L   Acid-base deficit 3.6 (H) 0.0 - 2.0 mmol/L   O2 Saturation 99.1 %   Collection site PERIPHERAL ARTERIAL LINE    Drawn by [phone]    Sample type ARTERIAL   Blood gas, arterial     Status: Abnormal (Preliminary result)   Collection Time: 01/24/18  8:55 AM  Result Value Ref Range   FIO2 0.72    Delivery systems VENTILATOR    VT 540 mL   LHR 12 resp/min   Peep/cpap 5.0 cm H20   pH, Arterial 7.441 7.350 - 7.450   pCO2 arterial 26.9 (L) 32.0 - 48.0 mmHg   pO2, Arterial 299 (H) 83.0 - 108.0 mmHg   Bicarbonate 18.0 (L) 20.0 - 28.0 mmol/L   Acid-base deficit 5.0 (H) 0.0 - 2.0 mmol/L   O2 Saturation 99.9 %   Collection site PERIPHERAL ARTERIAL LINE    Drawn by [phone]    Sample type  ARTERIAL    Allens test (pass/fail) PENDING PASS  Calcium, ionized     Status: None   Collection Time: 01/24/18  8:55 AM  Result Value Ref Range   Calcium, Ionized, Serum 0.95 mg/dL  CBC     Status: Abnormal   Collection Time: 01/24/18  9:00 AM  Result Value Ref Range   WBC 9.9 4.0 - 10.5 K/uL   RBC 2.86 (L) 3.87 - 5.11 MIL/uL   Hemoglobin 9.1 (L) 12.0 - 15.0 g/dL   HCT 09.826.0 (L) 11.936.0 - 14.746.0 %   MCV 90.9 78.0 - 100.0 fL   MCH 31.8 26.0 - 34.0 pg   MCHC 35.0 30.0 - 36.0 g/dL   RDW 82.914.1 56.211.5 - 13.015.5 %   Platelets 37 (L) 150 - 400 K/uL  Protime-INR     Status: Abnormal   Collection Time: 01/24/18  9:00 AM  Result Value Ref Range   Prothrombin Time 18.9 (H) 11.4 - 15.2 seconds   INR 1.60   APTT     Status: Abnormal   Collection Time: 01/24/18  9:00 AM  Result Value Ref Range   aPTT 41 (H) 24 - 36 seconds  Fibrinogen     Status: Abnormal   Collection Time: 01/24/18  9:00 AM  Result Value Ref Range   Fibrinogen 140 (L) 210 - 475 mg/dL  Save smear     Status: None   Collection Time: 01/24/18  9:00 AM  Result Value Ref Range   Smear Review SMEAR STAINED AND AVAILABLE FOR REVIEW   MRSA PCR Screening     Status: None   Collection Time: 01/24/18 10:47 AM  Result Value Ref Range   MRSA by PCR NEGATIVE NEGATIVE  Glucose, capillary     Status: Abnormal   Collection Time: 01/24/18 11:18 AM  Result Value Ref Range   Glucose-Capillary 146 (H) 65 - 99 mg/dL   Comment 1 Capillary Specimen    Comment 2 Notify RN   I-STAT 3, arterial blood gas (G3+)     Status: Abnormal  Collection Time: 01/24/18 11:19 AM  Result Value Ref Range   pH, Arterial 7.357 7.350 - 7.450   pCO2 arterial 39.8 32.0 - 48.0 mmHg   pO2, Arterial 247.0 (H) 83.0 - 108.0 mmHg   Bicarbonate 22.4 20.0 - 28.0 mmol/L   TCO2 24 22 - 32 mmol/L   O2 Saturation 100.0 %   Acid-base deficit 3.0 (H) 0.0 - 2.0 mmol/L   Patient temperature 98.2 F    Collection site ARTERIAL LINE    Drawn by RT    Sample type ARTERIAL   Type  and screen [facility]     Status: None (Preliminary result)   Collection Time: 01/24/18 11:19 AM  Result Value Ref Range   ABO/RH(D) B POS    Antibody Screen NEG    Sample Expiration 01/27/2018    Unit Number [id number]    Blood Component Type RED CELLS,LR    Unit division 00    Status of Unit ISSUED    Transfusion Status OK TO TRANSFUSE    Crossmatch Result      Compatible Performed at [facility], [address] [place], [place] [address]   Comprehensive metabolic panel     Status: Abnormal   Collection Time: 01/24/18 11:54 AM  Result Value Ref Range   Sodium 136 135 - 145 mmol/L   Potassium 3.3 (L) 3.5 - 5.1 mmol/L   Chloride 108 101 - 111 mmol/L   CO2 20 (L) 22 - 32 mmol/L   Glucose, Bld 159 (H) 65 - 99 mg/dL   BUN 5 (L) 6 - 20 mg/dL   Creatinine, Ser 7.82 0.44 - 1.00 mg/dL   Calcium 6.7 (L) 8.9 - 10.3 mg/dL   Total Protein 3.5 (L) 6.5 - 8.1 g/dL   Albumin 2.1 (L) 3.5 - 5.0 g/dL   AST 19 15 - 41 U/L   ALT 9 (L) 14 - 54 U/L   Alkaline Phosphatase 34 (L) 38 - 126 U/L   Total Bilirubin 1.6 (H) 0.3 - 1.2 mg/dL   GFR calc non Af Amer >60 >60 mL/min   GFR calc Af Amer >60 >60 mL/min   Anion gap 8 5 - 15  Magnesium     Status: Abnormal   Collection Time: 01/24/18 11:54 AM  Result Value Ref Range   Magnesium 1.1 (L) 1.7 - 2.4 mg/dL  Phosphorus     Status: Abnormal   Collection Time: 01/24/18 11:54 AM  Result Value Ref Range   Phosphorus 2.1 (L) 2.5 - 4.6 mg/dL  Lactic acid, plasma     Status: None   Collection Time: 01/24/18 11:54 AM  Result Value Ref Range   Lactic Acid, Venous 1.3 0.5 - 1.9 mmol/L  CBC     Status: Abnormal   Collection Time: 01/24/18 11:54 AM  Result Value Ref Range   WBC 8.5 4.0 - 10.5 K/uL   RBC 2.27 (L) 3.87 - 5.11 MIL/uL   Hemoglobin 7.1 (L) 12.0 - 15.0 g/dL   HCT 95.6 (L) 21.3 - 08.6 %   MCV 90.3 78.0 - 100.0 fL   MCH 31.3 26.0 - 34.0 pg   MCHC 34.6 30.0 - 36.0 g/dL   RDW 57.8 46.9 - 62.9 %   Platelets 35 (L)  150 - 400 K/uL  Protime-INR     Status: Abnormal   Collection Time: 01/24/18 11:54 AM  Result Value Ref Range   Prothrombin Time 18.0 (H) 11.4 - 15.2 seconds   INR 1.50  APTT     Status: Abnormal   Collection Time: 01/24/18 11:54 AM  Result Value Ref Range   aPTT 39 (H) 24 - 36 seconds  Triglycerides     Status: Abnormal   Collection Time: 01/24/18 11:54 AM  Result Value Ref Range   Triglycerides 152 (H) <150 mg/dL  T4, free     Status: None   Collection Time: 01/24/18 11:54 AM  Result Value Ref Range   Free T4 0.82 0.61 - 1.12 ng/dL  TSH     Status: None   Collection Time: 01/24/18 11:54 AM  Result Value Ref Range   TSH 1.125 0.350 - 4.500 uIU/mL  ABO/Rh     Status: None   Collection Time: 01/24/18  1:18 PM  Result Value Ref Range   ABO/RH(D)      B POS Performed at [facility], [address] [place], [place] [address]   Prepare RBC     Status: None   Collection Time: 01/24/18  1:18 PM  Result Value Ref Range   Order Confirmation      ORDER PROCESSED BY BLOOD BANK Performed at [facility], [address], [place], [place] [address]      A/P    Post operative day 0 Cesearean section for failed induction term AMA; Supra cervical hysterectomy and L salpingectomy for uterine bleeding from abnormal vessels and presumed endometriosis on the outside of the fundus and atony within.  Pt still intubated- when pt stable from cardiac stand point and hemaglobin is stable and she still appears to be hemostatic, she can be extubated when CV agrees.  H/H dropped to 7.1 and she is receiving another unit of blood.    Pt has breast pump to bedside and is using to stimulate breasts.  Baby is doing well at [facility].

## 2018-01-24 NOTE — Anesthesia Procedure Notes (Signed)
Arterial Line Insertion Start/End2/16/2019 7:44 AM, 01/24/2018 7:45 AM Performed by: Mal AmabileFoster, Taeveon Keesling, MD, anesthesiologist  Preanesthetic checklist: patient identified, IV checked, monitors and equipment checked and anesthesia consent Emergency situation Lidocaine 1% used for infiltration Left, radial was placed Catheter size: 20 G Hand hygiene performed  and maximum sterile barriers used  Allen's test indicative of satisfactory collateral circulation Attempts: 1 Procedure performed without using ultrasound guided technique. Following insertion, dressing applied and Biopatch. Post procedure assessment: normal  Patient tolerated the procedure well with no immediate complications.

## 2018-01-25 ENCOUNTER — Inpatient Hospital Stay (HOSPITAL_COMMUNITY): Payer: BLUE CROSS/BLUE SHIELD

## 2018-01-25 LAB — TYPE AND SCREEN
ABO/RH(D): B POS
Antibody Screen: NEGATIVE
UNIT DIVISION: 0
UNIT DIVISION: 0
UNIT DIVISION: 0
UNIT DIVISION: 0
Unit division: 0
Unit division: 0
Unit division: 0
Unit division: 0
Unit division: 0
Unit division: 0

## 2018-01-25 LAB — BPAM RBC
BLOOD PRODUCT EXPIRATION DATE: 201903112359
BLOOD PRODUCT EXPIRATION DATE: 201903112359
BLOOD PRODUCT EXPIRATION DATE: 201903182359
BLOOD PRODUCT EXPIRATION DATE: 201903182359
Blood Product Expiration Date: 201903052359
Blood Product Expiration Date: 201903072359
Blood Product Expiration Date: 201903112359
Blood Product Expiration Date: 201903112359
Blood Product Expiration Date: 201903182359
Blood Product Expiration Date: 201903182359
ISSUE DATE / TIME: 201902160728
ISSUE DATE / TIME: 201902160744
ISSUE DATE / TIME: 201902160744
ISSUE DATE / TIME: 201902160728
ISSUE DATE / TIME: 201902160744
ISSUE DATE / TIME: 201902160744
ISSUE DATE / TIME: 201902171107
UNIT TYPE AND RH: 5100
UNIT TYPE AND RH: 5100
UNIT TYPE AND RH: 5100
Unit Type and Rh: 5100
Unit Type and Rh: 5100
Unit Type and Rh: 5100
Unit Type and Rh: 5100
Unit Type and Rh: 5100
Unit Type and Rh: 5100
Unit Type and Rh: 5100

## 2018-01-25 LAB — PREPARE FRESH FROZEN PLASMA
UNIT DIVISION: 0
UNIT DIVISION: 0
UNIT DIVISION: 0
Unit division: 0
Unit division: 0
Unit division: 0

## 2018-01-25 LAB — BPAM FFP
BLOOD PRODUCT EXPIRATION DATE: 201902212359
BLOOD PRODUCT EXPIRATION DATE: 201902212359
BLOOD PRODUCT EXPIRATION DATE: 201902212359
Blood Product Expiration Date: 201902212359
Blood Product Expiration Date: 201902212359
Blood Product Expiration Date: 201902212359
ISSUE DATE / TIME: 201902160805
ISSUE DATE / TIME: 201902160805
ISSUE DATE / TIME: 201902160836
ISSUE DATE / TIME: 201902160836
UNIT TYPE AND RH: 7300
UNIT TYPE AND RH: 7300
UNIT TYPE AND RH: 7300
UNIT TYPE AND RH: 7300
Unit Type and Rh: 7300
Unit Type and Rh: 7300

## 2018-01-25 LAB — POCT I-STAT 3, ART BLOOD GAS (G3+)
Acid-base deficit: 3 mmol/L — ABNORMAL HIGH (ref 0.0–2.0)
Bicarbonate: 22.4 mmol/L (ref 20.0–28.0)
O2 Saturation: 99 %
TCO2: 24 mmol/L (ref 22–32)
pCO2 arterial: 40 mmHg (ref 32.0–48.0)
pH, Arterial: 7.357 (ref 7.350–7.450)
pO2, Arterial: 145 mmHg — ABNORMAL HIGH (ref 83.0–108.0)

## 2018-01-25 LAB — PREPARE RBC (CROSSMATCH)

## 2018-01-25 LAB — CBC
HCT: 23.1 % — ABNORMAL LOW (ref 36.0–46.0)
HEMATOCRIT: 22.2 % — AB (ref 36.0–46.0)
HEMOGLOBIN: 8 g/dL — AB (ref 12.0–15.0)
Hemoglobin: 7.8 g/dL — ABNORMAL LOW (ref 12.0–15.0)
MCH: 30.8 pg (ref 26.0–34.0)
MCH: 31 pg (ref 26.0–34.0)
MCHC: 34.6 g/dL (ref 30.0–36.0)
MCHC: 35.1 g/dL (ref 30.0–36.0)
MCV: 88.8 fL (ref 78.0–100.0)
MCV: 88.1 fL (ref 78.0–100.0)
PLATELETS: 67 10*3/uL — AB (ref 150–400)
Platelets: 32 10*3/uL — ABNORMAL LOW (ref 150–400)
RBC: 2.6 MIL/uL — ABNORMAL LOW (ref 3.87–5.11)
RBC: 2.52 MIL/uL — ABNORMAL LOW (ref 3.87–5.11)
RDW: 13.5 % (ref 11.5–15.5)
RDW: 14.4 % (ref 11.5–15.5)
WBC: 8.4 10*3/uL (ref 4.0–10.5)
WBC: 11.4 10*3/uL — AB (ref 4.0–10.5)

## 2018-01-25 LAB — HIV ANTIBODY (ROUTINE TESTING W REFLEX): HIV Screen 4th Generation wRfx: NONREACTIVE

## 2018-01-25 LAB — BPAM PLATELET PHERESIS
Blood Product Expiration Date: 201902162359
ISSUE DATE / TIME: 201902161939
UNIT TYPE AND RH: 600

## 2018-01-25 LAB — CBC WITH DIFFERENTIAL/PLATELET
Basophils Absolute: 0 10*3/uL (ref 0.0–0.1)
Basophils Relative: 0 %
EOS ABS: 0.1 10*3/uL (ref 0.0–0.7)
EOS PCT: 1 %
HCT: 19.5 % — ABNORMAL LOW (ref 36.0–46.0)
Hemoglobin: 6.8 g/dL — CL (ref 12.0–15.0)
Lymphocytes Relative: 7 %
Lymphs Abs: 0.7 10*3/uL (ref 0.7–4.0)
MCH: 31.2 pg (ref 26.0–34.0)
MCHC: 34.9 g/dL (ref 30.0–36.0)
MCV: 89.4 fL (ref 78.0–100.0)
MONO ABS: 1 10*3/uL (ref 0.1–1.0)
MONOS PCT: 11 %
Neutro Abs: 7.6 10*3/uL (ref 1.7–7.7)
Neutrophils Relative %: 81 %
PLATELETS: 66 10*3/uL — AB (ref 150–400)
RBC: 2.18 MIL/uL — AB (ref 3.87–5.11)
RDW: 14.1 % (ref 11.5–15.5)
WBC: 9.3 10*3/uL (ref 4.0–10.5)

## 2018-01-25 LAB — BASIC METABOLIC PANEL
Anion gap: 5 (ref 5–15)
CALCIUM: 6.9 mg/dL — AB (ref 8.9–10.3)
CO2: 21 mmol/L — ABNORMAL LOW (ref 22–32)
CREATININE: 0.55 mg/dL (ref 0.44–1.00)
Chloride: 110 mmol/L (ref 101–111)
GFR calc Af Amer: >60 mL/min (ref >60)
Glucose, Bld: 112 mg/dL — ABNORMAL HIGH (ref 65–99)
Potassium: 4.1 mmol/L (ref 3.5–5.1)
SODIUM: 136 mmol/L (ref 135–145)

## 2018-01-25 LAB — FIBRINOGEN: Fibrinogen: 279 mg/dL (ref 210–475)

## 2018-01-25 LAB — PREPARE PLATELET PHERESIS: Unit division: 0

## 2018-01-25 LAB — HEMOGLOBIN AND HEMATOCRIT, BLOOD
HEMATOCRIT: 20.1 % — AB (ref 36.0–46.0)
HEMOGLOBIN: 7 g/dL — AB (ref 12.0–15.0)

## 2018-01-25 LAB — ABO/RH: ABO/RH(D): B POS

## 2018-01-25 LAB — PROTIME-INR
INR: 1.3
Prothrombin Time: 16.1 seconds — ABNORMAL HIGH (ref 11.4–15.2)

## 2018-01-25 LAB — APTT: APTT: 39 s — AB (ref 24–36)

## 2018-01-25 LAB — D-DIMER, QUANTITATIVE: D-Dimer, Quant: 1.16 ug/mL-FEU — ABNORMAL HIGH (ref 0.00–0.50)

## 2018-01-25 MED ORDER — IOPAMIDOL (ISOVUE-300) INJECTION 61%
INTRAVENOUS | Status: AC
  Administered 2018-01-25: 100 mL
  Filled 2018-01-25: qty 100

## 2018-01-25 MED ORDER — SODIUM CHLORIDE 0.9 % IV SOLN
Freq: Once | INTRAVENOUS | Status: AC
  Administered 2018-01-25: 05:00:00 via INTRAVENOUS

## 2018-01-25 MED ORDER — FUROSEMIDE 10 MG/ML IJ SOLN
20.0000 mg | Freq: Two times a day (BID) | INTRAMUSCULAR | Status: DC
Start: 2018-01-25 — End: 2018-01-27
  Administered 2018-01-25 – 2018-01-27 (×5): 20 mg via INTRAVENOUS
  Filled 2018-01-25 (×5): qty 2

## 2018-01-25 MED ORDER — SODIUM CHLORIDE 0.9 % IV BOLUS (SEPSIS)
1000.0000 mL | Freq: Once | INTRAVENOUS | Status: AC
  Administered 2018-01-25: 1000 mL via INTRAVENOUS

## 2018-01-25 MED ORDER — POTASSIUM PHOSPHATES 15 MMOLE/5ML IV SOLN
20.0000 mmol | Freq: Once | INTRAVENOUS | Status: AC
  Administered 2018-01-25: 20 mmol via INTRAVENOUS
  Filled 2018-01-25: qty 6.67

## 2018-01-25 MED ORDER — COCONUT OIL OIL
1.0000 "application " | TOPICAL_OIL | Status: DC | PRN
  Filled 2018-01-25: qty 120

## 2018-01-25 MED ORDER — ALBUMIN HUMAN 25 % IV SOLN
25.0000 g | Freq: Two times a day (BID) | INTRAVENOUS | Status: AC
  Administered 2018-01-25 – 2018-01-26 (×4): 25 g via INTRAVENOUS
  Filled 2018-01-25 (×4): qty 50
  Filled 2018-01-25: qty 150

## 2018-01-25 NOTE — Progress Notes (Signed)
eLink Physician-Brief Progress Note Patient Name: Heather BarriosQuanesha V Mellette DOB: 06/06/1981 MRN: [id number]   Date of Service  01/25/2018  HPI/Events of Note  Hypotension - BP = 107/44 with MAP = 61. Last Hgb = 8.0.   eICU Interventions  Will order: 1. Bolus with 0.9 NaCl 1 liter IV over 1 hour now.      Intervention Category Major Interventions: Hypotension - evaluation and management  Sommer,Steven Eugene 01/25/2018, 12:20 AM

## 2018-01-25 NOTE — Progress Notes (Signed)
PULMONARY / CRITICAL CARE MEDICINE   Name: Heather Pena MRN: [id number] DOB: [birth year]              PULMONARY / CRITICAL CARE MEDICINE   Name: Heather Pena MRN: [id number] DOB: [birth year]    ADMISSION DATE:  01/23/2018    REFERRING MD:  Henderson Cloud  CHIEF COMPLAINT: Post-op bleeding in setting of elective Caesarian section  HISTORY OF PRESENT ILLNESS:   The patient is a 37 yo B/F  Primigravida who ubnderwent C section today and dleivered a baby boy. Her operative course was complicated by massive bleeding. She apparently had massively doialted myopmetrial veins and adhesions on the psterior uterine wall. The patient required  6 units of blood and was administered 4 units of FFP as well. In addiotion, she received about 3 additional liters of crystalloid. Her last hb was about 9.1 at 9AM BP is presently is 140/60 with a heart rate of 110-120. The patient is in a sinus tach rhythm. Her last platelet count was 155. Fibrinogen level was ok this AM. The patient was given 1 gm of Tranxemic acid during the procedure. A line was inserted by anaesthesia. I received a call from anaesthesia and requesting a bed in the ICU. The patient is not on any pressors at this time.  2/17  hb dropped this AM to 6.8. iot was 8 at 345 in the AM. The patient has been hemodynamically stable.nevertheless a  CT scan was ordered to nensure the patient wasn't actively bleeding. The results of CT scan do not confirm active bleeding The patient received 1 unit of pRBCs Her platelt count appears to be holding in trhe mid 60s. The patient got a pheresis of platlets last night    PAST MEDICAL HISTORY :  She  has a past medical history of AMA (advanced maternal age) primigravida 35+, Anemia, Asthma, Hashimoto's disease, Headache, Herpes, History of endometriosis, Hypothyroidism, Newborn product of in vitro fertilization (IVF) pregnancy, and PONV (postoperative nausea and vomiting).  PAST SURGICAL  HISTORY: She  has a past surgical history that includes Colonoscopy (2012); Endometrial ablation (2009); Finger fracture surgery (2004); Diagnostic laparoscopy; Wisdom tooth extraction (Bilateral); laparoscopy (N/A, 04/20/2015); Chromopertubation (N/A, 04/20/2015); Dilatation & currettage/hysteroscopy with resectoscope (04/20/2015); Hernia repair; and Cesarean section (N/A, 01/24/2018).  Allergies  Allergen Reactions  . Metronidazole Nausea And Vomiting  . Sulfonamide Derivatives Hives    No current facility-administered medications on file prior to encounter.    Current Outpatient Medications on File Prior to Encounter  Medication Sig  . acetaminophen (TYLENOL) 500 MG tablet Take 1,000 mg by mouth every 6 (six) hours as needed for moderate pain or headache.  . polyethylene glycol (MIRALAX / GLYCOLAX) packet Take 17 g by mouth daily.  . Prenatal Vit-Fe Fumarate-FA (PRENATAL MULTIVITAMIN) TABS tablet Take 1 tablet by mouth daily at 12 noon.  . ranitidine (ZANTAC) 150 MG tablet Take 150 mg by mouth 2 (two) times daily.  . valACYclovir (VALTREX) 500 MG tablet Take 500 mg by mouth 2 (two) times daily.  Marland Kitchen albuterol (PROVENTIL HFA;VENTOLIN HFA) 108 (90 BASE) MCG/ACT inhaler Inhale 1-2 puffs into the lungs every 6 (six) hours as needed for wheezing or shortness of breath.     FAMILY HISTORY:  Her indicated that her mother is alive. She indicated that her father is alive. She indicated that the status of her cousin is unknown.   SOCIAL HISTORY: She  reports that  has never smoked. she has never used smokeless tobacco. She reports that  she drinks alcohol. She reports that she does not use drugs.  REVIEW OF SYSTEMS:   Not available  SUBJECTIVE:  Not available  VITAL SIGNS: BP 102/63   Pulse 99   Temp 98.4 F (36.9 C) (Oral)   Resp 16   Ht 5' (1.524 m)   Wt 164 lb 7.4 oz (74.6 kg)   LMP 04/16/2017   SpO2 100%   Breastfeeding? Unknown   BMI 32.12 kg/m   HEMODYNAMICS:  Thus far fairly  stable. Heart rate ahs come downic.  Mainatining BP .  VENTILATOR SETTINGS: Vent Mode: PRVC FiO2 (%):  [30 %-50 %] 30 % Set Rate:  [18 bmp] 18 bmp Vt Set:  [370 mL-380 mL] 370 mL PEEP:  [5 cmH20] 5 cmH20 Plateau Pressure:  [11 cmH20-13 cmH20] 13 cmH20  INTAKE / OUTPUT: I/O last 3 completed shifts: In: 13082.2 [I.V.:7912.6; Blood:3439.6; NG/GT:30; IV Piggyback:1700] Out: 16109 [Urine:2475; Emesis/NG output:100; Blood:8196]  PHYSICAL EXAMINATION: General:  Young B/F on ventitlator sedated Neuro:  Obtunded , was apparently moving all extremities HEENT:  [place]/AT , ET tube, NGT Cardiovascular: RRR s1s2 Lungs:  Bilateral BS Abdomen:  Soft, scar abdomen with large binder, dry Musculoskeletal:  WNL EXTR:: 2-3 + edema to the thighs Skin: Intact  LABS:  BMET Recent Labs  Lab 01/24/18 1154 01/25/18 0347  NA 136 136  K 3.3* 4.1  CL 108 110  CO2 20* 21*  BUN 5* <5*  CREATININE 0.62 0.55  GLUCOSE 159* 112*    Electrolytes Recent Labs  Lab 01/24/18 1154 01/25/18 0347  CALCIUM 6.7* 6.9*  MG 1.1*  --   PHOS 2.1*  --     CBC Recent Labs  Lab 01/24/18 1819 01/25/18 0347 01/25/18 0841  WBC 8.4 9.3 11.4*  HGB 8.0* 6.8* 7.8*  HCT 23.1* 19.5* 22.2*  PLT 32* 66* 67*    Coag's Recent Labs  Lab 01/24/18 0900 01/24/18 1154 01/25/18 0841  APTT 41* 39* 39*  INR 1.60 1.50 1.30    Sepsis Markers Recent Labs  Lab 01/24/18 1154  LATICACIDVEN 1.3    ABG Recent Labs  Lab 01/24/18 0855 01/24/18 1119 01/25/18 0406  PHART 7.441 7.357 7.357  PCO2ART 26.9* 39.8 40.0  PO2ART 299* 247.0* 145.0*    Liver Enzymes Recent Labs  Lab 01/24/18 1154  AST 19  ALT 9*  ALKPHOS 34*  BILITOT 1.6*  ALBUMIN 2.1*    Cardiac Enzymes No results for input(s): TROPONINI, PROBNP in the last 168 hours.  Glucose Recent Labs  Lab 01/24/18 1118  GLUCAP 146*    Imaging Dg Abd 1 View  Result Date: 01/24/2018 CLINICAL DATA:  NG tube placement. EXAM: ABDOMEN - 1 VIEW  COMPARISON:  None. FINDINGS: The tip of an enteric tube projects over the proximal gastric body with side hole in the region of the GE junction. Gas and a small amount of stool are present in the colon. No dilated loops of bowel are seen to suggest obstruction. No acute osseous abnormality is identified. IMPRESSION: Enteric tube terminates in the stomach with side hole near the GE junction. Electronically Signed   By: Sebastian Ache M.D.   On: 01/24/2018 13:07   Ct Abdomen Pelvis W Contrast  Result Date: 01/25/2018 CLINICAL DATA:  Status post cesarean section and emergent hysterectomy for bleeding yesterday. EXAM: CT ABDOMEN AND PELVIS WITH CONTRAST TECHNIQUE: Multidetector CT imaging of the abdomen and pelvis was performed using the standard protocol following bolus administration of intravenous contrast. CONTRAST:  ISOVUE-300 IOPAMIDOL (ISOVUE-300)  INJECTION 61% COMPARISON:  None. FINDINGS: Lower chest: Dependent atelectasis noted in the lower lobes with small bilateral pleural effusions. Hepatobiliary: Tiny hypodensities in the liver parenchyma are too small to characterize but likely cysts. There is no evidence for gallstones, gallbladder wall thickening, or pericholecystic fluid. No intrahepatic or extrahepatic biliary dilation. Pancreas: No focal mass lesion. No dilatation of the main duct. No intraparenchymal cyst. No peripancreatic edema. Spleen: No splenomegaly. No focal mass lesion. Adrenals/Urinary Tract: No adrenal nodule or mass. Right kidney unremarkable. Mild fullness noted left intrarenal collecting system and proximal left ureter. Bladder is decompressed by Foley catheter. Stomach/Bowel: NG tube tip is positioned in the distal esophagus. Mild to moderate distention of stomach. Duodenum is normally positioned as is the ligament of Treitz. No small bowel or colonic dilatation. Vascular/Lymphatic: No abdominal aortic aneurysm. Portal vein and superior mesenteric vein are patent. There is no  gastrohepatic or hepatoduodenal ligament lymphadenopathy. No intraperitoneal or retroperitoneal lymphadenopathy. No pelvic sidewall lymphadenopathy. Reproductive: Uterus is surgically absent. The cervix is prominent and appears enlarged/edematous. Tissue planes in the pelvic floor and in the region of the cervix are not well preserved given the substantial soft tissue edema and intraperitoneal free fluid. Prominent opacification of the left gonadal vasculature is evident. Other: Moderate to large volume of free fluid is identified in the peritoneal cavity of the abdomen and pelvis. This fluid measures slightly higher in attenuation than would be expected for simple or serous fluid but no findings of overt hemoperitoneum evident by CT. Given the history of bleeding during surgery yesterday, there is likely some hemorrhagic component to the fluid on today's exam resulting in the minimally higher attenuation than expected for simple fluid. No gross intraperitoneal blood clots are evident. There is no finding to suggest active extravasation on the current study. Extensive body wall and mesenteric edema is evident. There is gas in the subcutaneous fat and rectus sheath compatible with the history of recent surgery. Fluid and gas in the rectus sheath itself is identified in the region of a low transverse incision. Musculoskeletal: Bone windows reveal no worrisome lytic or sclerotic osseous lesions. IMPRESSION: 1. Moderate to large volume intraperitoneal free fluid. Average attenuation of this fluid is slightly higher than would be expected for simple or serous fluid suggesting complication by hemorrhage. The fluid does not have attenuation as high as typically seen for acute or frank hemoperitoneum and there is no evidence for clots within the fluid. No findings of active extravasation on today's CT scan. 2. Enlarged, edematous cervix in this patient one-day from cesarean section and emergent hysterectomy. There is edema  and obscuration of fat planes in the pelvic for. 3. Marked body wall edema. 4. Above findings were discussed with Dr. Henderson Cloud at the time of initial interpretation. NG tube tip 5. NG tube tip is in the distal esophagus. This finding was called directly to the patient's nurse, Selenia, at 10:06 a.m. on 01/25/2018. Electronically Signed   By: Kennith Center M.D.   On: 01/25/2018 10:10   Dg Chest Port 1 View  Result Date: 01/24/2018 CLINICAL DATA:  Nasogastric tube placement. Recent cesarean section. EXAM: PORTABLE CHEST 1 VIEW COMPARISON:  None FINDINGS: Endotracheal tube is 3.9 cm above the carina. Nasogastric tube extends into the abdomen and the tip appears to be in the stomach body region. Multiple lines overlying the chest. Heart size is within normal limits. Haziness at the lung bases could represent atelectasis. Negative for pneumothorax. IMPRESSION: Nasogastric tube appears to be in the stomach.  Mild basilar atelectasis. Electronically Signed   By: Richarda Overlie M.D.   On: 01/24/2018 13:07      SIGNIFICANT EVENTS: As described above in the hsitory  LINES/TUBES: ET, NGT, foley 2/16  DISCUSSION:    ASSESSMENT / PLAN:  PULMONARY  The patient  remains intubated and willl likely remain so 1-2 days. O2 saturation 100% on 50% FI02 CXR pending RASS goal: -1 to -2 Will probably keep the patient intubated atleast one more day   GYN bleeding Was apparently satisfactorily controlled prior to transfer. The patine required a TAH due to the massive bleeding and difficulty controlling  it post C section.  Dr. Henderson Cloud will monitor HB and be available if further bleeding issues surrounding the surgery become apparent.  Hemodynamics  BP stable presently . Patiet a bit tachycardic. As noted will be following up o Hb later in the day. Not pnpressors presently. The patient has third spacing of fluid and albumin is low.  Received over 13 liters yesterdday.  Hematological We will be checking her HB  and platelets and coagulation factors later in the day.  Patient will not be on anticoagulation. Has been put on Gi prophylaxis      FAMILY  Discusssed latest developments with family      Jamesetta So MD Pulmonary and Critical Care Medicine [facility] Pager: [phone]  01/25/2018, 10:25 AM   PAST MEDICAL HISTORY :  She  has a past medical history of AMA (advanced maternal age) primigravida 35+, Anemia, Asthma, Hashimoto's disease, Headache, Herpes, History of endometriosis, Hypothyroidism, Newborn product of in vitro fertilization (IVF) pregnancy, and PONV (postoperative nausea and vomiting).  PAST SURGICAL HISTORY: She  has a past surgical history that includes Colonoscopy (2012); Endometrial ablation (2009); Finger fracture surgery (2004); Diagnostic laparoscopy; Wisdom tooth extraction (Bilateral); laparoscopy (N/A, 04/20/2015); Chromopertubation (N/A, 04/20/2015); Dilatation & currettage/hysteroscopy with resectoscope (04/20/2015); Hernia repair; and Cesarean section (N/A, 01/24/2018).  Allergies  Allergen Reactions  . Metronidazole Nausea And Vomiting  . Sulfonamide Derivatives Hives    No current facility-administered medications on file prior to encounter.    Current Outpatient Medications on File Prior to Encounter  Medication Sig  . acetaminophen (TYLENOL) 500 MG tablet Take 1,000 mg by mouth every 6 (six) hours as needed for moderate pain or headache.  . polyethylene glycol (MIRALAX / GLYCOLAX) packet Take 17 g by mouth daily.  . Prenatal Vit-Fe Fumarate-FA (PRENATAL MULTIVITAMIN) TABS tablet Take 1 tablet by mouth daily at 12 noon.  . ranitidine (ZANTAC) 150 MG tablet Take 150 mg by mouth 2 (two) times daily.  . valACYclovir (VALTREX) 500 MG tablet Take 500 mg by mouth 2 (two) times daily.  Marland Kitchen albuterol (PROVENTIL HFA;VENTOLIN HFA) 108 (90 BASE) MCG/ACT inhaler Inhale 1-2 puffs into the lungs every 6 (six) hours as needed for wheezing or shortness of  breath.     FAMILY HISTORY:  Her indicated that her mother is alive. She indicated that her father is alive. She indicated that the status of her cousin is unknown.   SOCIAL HISTORY: She  reports that  has never smoked. she has never used smokeless tobacco. She reports that she drinks alcohol. She reports that she does not use drugs.  VITAL SIGNS: BP 102/63   Pulse 99   Temp 98.4 F (36.9 C) (Oral)   Resp 16   Ht 5' (1.524 m)   Wt 164 lb 7.4 oz (74.6 kg)   LMP 04/16/2017   SpO2 100%   Breastfeeding? Unknown  BMI 32.12 kg/m   HEMODYNAMICS:    VENTILATOR SETTINGS: Vent Mode: PRVC FiO2 (%):  [30 %-50 %] 30 % Set Rate:  [18 bmp] 18 bmp Vt Set:  [370 mL-380 mL] 370 mL PEEP:  [5 cmH20] 5 cmH20 Plateau Pressure:  [11 cmH20-13 cmH20] 13 cmH20  INTAKE / OUTPUT: I/O last 3 completed shifts: In: 13082.2 [I.V.:7912.6; Blood:3439.6; NG/GT:30; IV Piggyback:1700] Out: 40981 [Urine:2475; Emesis/NG output:100; Blood:8196]  PHYSICAL EXAMINATION: General:  wdwn b/F on ventiator Neuro:  Arouses and responds HEENT: ET and OGT Cardiovascular:  RRR s1 s2 Lungs: Bilaterla Bs Abdomen: soft, BS lower midline dressing  Still has epiduaral catheter in place    LABS:  BMET Recent Labs  Lab 01/24/18 1154 01/25/18 0347  NA 136 136  K 3.3* 4.1  CL 108 110  CO2 20* 21*  BUN 5* <5*  CREATININE 0.62 0.55  GLUCOSE 159* 112*    Electrolytes Recent Labs  Lab 01/24/18 1154 01/25/18 0347  CALCIUM 6.7* 6.9*  MG 1.1*  --   PHOS 2.1*  --     CBC Recent Labs  Lab 01/24/18 1819 01/25/18 0347 01/25/18 0841  WBC 8.4 9.3 11.4*  HGB 8.0* 6.8* 7.8*  HCT 23.1* 19.5* 22.2*  PLT 32* 66* 67*    Coag's Recent Labs  Lab 01/24/18 0900 01/24/18 1154 01/25/18 0841  APTT 41* 39* 39*  INR 1.60 1.50 1.30    Sepsis Markers Recent Labs  Lab 01/24/18 1154  LATICACIDVEN 1.3    ABG Recent Labs  Lab 01/24/18 0855 01/24/18 1119 01/25/18 0406  PHART 7.441 7.357 7.357   PCO2ART 26.9* 39.8 40.0  PO2ART 299* 247.0* 145.0*    Liver Enzymes Recent Labs  Lab 01/24/18 1154  AST 19  ALT 9*  ALKPHOS 34*  BILITOT 1.6*  ALBUMIN 2.1*    Cardiac Enzymes No results for input(s): TROPONINI, PROBNP in the last 168 hours.  Glucose Recent Labs  Lab 01/24/18 1118  GLUCAP 146*    Imaging Dg Abd 1 View  Result Date: 01/24/2018 CLINICAL DATA:  NG tube placement. EXAM: ABDOMEN - 1 VIEW COMPARISON:  None. FINDINGS: The tip of an enteric tube projects over the proximal gastric body with side hole in the region of the GE junction. Gas and a small amount of stool are present in the colon. No dilated loops of bowel are seen to suggest obstruction. No acute osseous abnormality is identified. IMPRESSION: Enteric tube terminates in the stomach with side hole near the GE junction. Electronically Signed   By: Sebastian Ache M.D.   On: 01/24/2018 13:07   Ct Abdomen Pelvis W Contrast  Result Date: 01/25/2018 CLINICAL DATA:  Status post cesarean section and emergent hysterectomy for bleeding yesterday. EXAM: CT ABDOMEN AND PELVIS WITH CONTRAST TECHNIQUE: Multidetector CT imaging of the abdomen and pelvis was performed using the standard protocol following bolus administration of intravenous contrast. CONTRAST:  ISOVUE-300 IOPAMIDOL (ISOVUE-300) INJECTION 61% COMPARISON:  None. FINDINGS: Lower chest: Dependent atelectasis noted in the lower lobes with small bilateral pleural effusions. Hepatobiliary: Tiny hypodensities in the liver parenchyma are too small to characterize but likely cysts. There is no evidence for gallstones, gallbladder wall thickening, or pericholecystic fluid. No intrahepatic or extrahepatic biliary dilation. Pancreas: No focal mass lesion. No dilatation of the main duct. No intraparenchymal cyst. No peripancreatic edema. Spleen: No splenomegaly. No focal mass lesion. Adrenals/Urinary Tract: No adrenal nodule or mass. Right kidney unremarkable. Mild fullness  noted left intrarenal collecting system and proximal left ureter. Bladder  is decompressed by Foley catheter. Stomach/Bowel: NG tube tip is positioned in the distal esophagus. Mild to moderate distention of stomach. Duodenum is normally positioned as is the ligament of Treitz. No small bowel or colonic dilatation. Vascular/Lymphatic: No abdominal aortic aneurysm. Portal vein and superior mesenteric vein are patent. There is no gastrohepatic or hepatoduodenal ligament lymphadenopathy. No intraperitoneal or retroperitoneal lymphadenopathy. No pelvic sidewall lymphadenopathy. Reproductive: Uterus is surgically absent. The cervix is prominent and appears enlarged/edematous. Tissue planes in the pelvic floor and in the region of the cervix are not well preserved given the substantial soft tissue edema and intraperitoneal free fluid. Prominent opacification of the left gonadal vasculature is evident. Other: Moderate to large volume of free fluid is identified in the peritoneal cavity of the abdomen and pelvis. This fluid measures slightly higher in attenuation than would be expected for simple or serous fluid but no findings of overt hemoperitoneum evident by CT. Given the history of bleeding during surgery yesterday, there is likely some hemorrhagic component to the fluid on today's exam resulting in the minimally higher attenuation than expected for simple fluid. No gross intraperitoneal blood clots are evident. There is no finding to suggest active extravasation on the current study. Extensive body wall and mesenteric edema is evident. There is gas in the subcutaneous fat and rectus sheath compatible with the history of recent surgery. Fluid and gas in the rectus sheath itself is identified in the region of a low transverse incision. Musculoskeletal: Bone windows reveal no worrisome lytic or sclerotic osseous lesions. IMPRESSION: 1. Moderate to large volume intraperitoneal free fluid. Average attenuation of this fluid  is slightly higher than would be expected for simple or serous fluid suggesting complication by hemorrhage. The fluid does not have attenuation as high as typically seen for acute or frank hemoperitoneum and there is no evidence for clots within the fluid. No findings of active extravasation on today's CT scan. 2. Enlarged, edematous cervix in this patient one-day from cesarean section and emergent hysterectomy. There is edema and obscuration of fat planes in the pelvic for. 3. Marked body wall edema. 4. Above findings were discussed with Dr. Henderson Cloud at the time of initial interpretation. NG tube tip 5. NG tube tip is in the distal esophagus. This finding was called directly to the patient's nurse, Selenia, at 10:06 a.m. on 01/25/2018. Electronically Signed   By: Kennith Center M.D.   On: 01/25/2018 10:10   Dg Chest Port 1 View  Result Date: 01/24/2018 CLINICAL DATA:  Nasogastric tube placement. Recent cesarean section. EXAM: PORTABLE CHEST 1 VIEW COMPARISON:  None FINDINGS: Endotracheal tube is 3.9 cm above the carina. Nasogastric tube extends into the abdomen and the tip appears to be in the stomach body region. Multiple lines overlying the chest. Heart size is within normal limits. Haziness at the lung bases could represent atelectasis. Negative for pneumothorax. IMPRESSION: Nasogastric tube appears to be in the stomach. Mild basilar atelectasis. Electronically Signed   By: Richarda Overlie M.D.   On: 01/24/2018 13:07      LINES/TUBES:  ET tube Foley OGT Arterial line   *  ASSESSMENT / PLAN:  PULMONARY  Doing well from a respiratory viewpt. Patient oxygenating well on low Fi02.    CARDIOVASCULAR Has had tremendous volume since her surgery and she has anasarca. I am going to give the patient some albumin and lasix.  RENAL Renal fn. Is normal  Thrombocytopenia Likely dilutional. Had 1 pheresis yesterday. No more platelewts for now.  GYN Dr.  Henderson CloudHorvath was here to see the patient ands she saw  her CT scan abdo/pelvis    FAMILY  I have discussed latelst developments with her family -  Pulmonary and Critical Care Medicine [facility] Pager: [phone]  01/25/2018, 10:24 AM

## 2018-01-25 NOTE — Progress Notes (Signed)
Received call from lab regarding a critical hgb 6.8. MD on call notified.

## 2018-01-25 NOTE — Progress Notes (Addendum)
  Patient is still intubated.  Responding appropriately.  Pain control is decent but still indicating abdominal pain.  S/p 1 unit last night and H/H 6.8 at 3 am.  Got another unit this morning and H/H is pending.  I/O 700 cc urine overnight.    Vitals:   01/25/18 0645 01/25/18 0700 01/25/18 0742 01/25/18 0800  BP: (!) 104/59 104/61  106/64  Pulse: (!) 106 (!) 107  (!) 107  Resp: 18 19  18   Temp: 98.7 F (37.1 C)  98.4 F (36.9 C)   TempSrc: Oral  Oral   SpO2: 96% 97%  98%  Weight:      Height:        lungs:  clear to auscultation cor:    RRR Abdomen:  soft, slightly more tenderness, incisions intact and without erythema or exudate.  No bleeding seen on honeycomb dressing.  Abd is slightly more distended today.  Bowel sounds are present before and after manipulation of dressing- good sign. However, there is some increase tenderness without guarding or rebound. ex:    no cords, scds on.   Perineum:  Clots on pad totaling about 150 cc.    Lab Results  Component Value Date   WBC 9.3 01/25/2018   HGB 6.8 (LL) 01/25/2018   HCT 19.5 (L) 01/25/2018   MCV 89.4 01/25/2018   PLT 66 (L) 01/25/2018   Bun/Cr are stable and normal. Coags have not been done since yesterday.   A/P    Post operative day 1 from Cesarean/Hysterectomy..  H/H is dropping.  Will get CT Abd/Pelvis and redraw coags to see if corrected.

## 2018-01-25 NOTE — Progress Notes (Signed)
  Patient is still intubated.  Responding to questions.  Output today 3110.  Vitals:   01/25/18 1600 01/25/18 1700 01/25/18 1800 01/25/18 1900  BP: (!) 98/59 (!) 98/55 (!) 88/52 (!) 87/53  Pulse: 95 92 92 91  Resp: 18 18 18 18   Temp:      TempSrc:      SpO2: 100% 100% 100% 100%  Weight:      Height:         Lab Results  Component Value Date   WBC 11.4 (H) 01/25/2018   HGB 7.8 (L) 01/25/2018   HCT 22.2 (L) 01/25/2018   MCV 88.1 01/25/2018   PLT 67 (L) 01/25/2018    --/--/B POS Performed at [facility], [address] [place], [place] [address]  (02/16 1318)/[place]  A/P    Post operative day 1.  Pt's BP slightly low tonight but vitals otherwise stable.  CT showed fluid in abdomen  but no active bleeding or collection of clot.  Plan is for extubation tomorrow.  Pt is  receiving albumin and lasix.

## 2018-01-25 NOTE — Progress Notes (Signed)
Pt transported on vent to CT and returned to [id number]. Pt's vitals remained stable throughout the trip.

## 2018-01-25 NOTE — Progress Notes (Signed)
3 maternity pads changed during shift, minimal sanguinous drainage from vagina.

## 2018-01-25 NOTE — Progress Notes (Signed)
Nurse tech did peri/foley care for pt, it was noted that the pt had a medium sized blood clot in the maternity pad. RN made aware. Will continue to monitor pt.

## 2018-01-25 NOTE — Anesthesia Post-op Follow-up Note (Signed)
  Anesthesia Pain Follow-up Note  Patient: Heather Pena  Day #: 2  Date of Follow-up: 01/25/2018 Time: 11:21 AM  Last Vitals:  Vitals:   01/25/18 0943 01/25/18 1000  BP:  106/70  Pulse: (!) 106 (!) 108  Resp: (!) 27 15  Temp:    SpO2: 100% 100%    Level of Consciousness: Sedated on vent  Pain: none   Side Effects:None  Catheter Site Exam:clean, no drainage     Plan: Patient remains mildly coagulopathic with elevated PT/PTT. Her platelet count is 67K. The epidural is not currently being used for analgesia. We will continue to follow per surgeons request and remove the catheter when the coagulopathy has resolved.  Tameyah Koch,W. EDMOND

## 2018-01-25 NOTE — Progress Notes (Addendum)
Reviewed CT Abd/pelvis with Dr. Molli PoseyMansell of Radiology.  Pt has a relatively large amount of fluid in abdomen that appears to be fluid and not blood.   He could find no evidence of active or chronic bleeding. Mild hydronephrosis was seen on L but delayed images not done. Bowel had gas but no stranding or free air to indicate injury. Edema was seen in abdominal wall tissues.  H/H this am after unit is 7.8/22.2, plt 67. Bun/Cr are still normal. Coags are starting to return to normal- PT better and fibrinogen is now normal.  Pt is likely third spacing and has not yet been caught up with blood loss.   Defer to Dr. Franchot Erichsenosenblatt as to appropriate time to extubate; pt does not have any signs of bleeding intrabdominally.

## 2018-01-25 NOTE — Progress Notes (Signed)
Per Dr Franchot Erichsenosenblatt, ok to keep systolic BP 100 and above.

## 2018-01-25 NOTE — Addendum Note (Signed)
Addendum  created 01/25/18 1128 by Gaynelle AduFitzgerald, Doshie Maggi, MD   Sign clinical note

## 2018-01-25 NOTE — Progress Notes (Signed)
eLink Physician-Brief Progress Note Patient Name: Ermalinda BarriosQuanesha V Tibbetts DOB: 06/22/1981 MRN: [id number]   Date of Service  01/25/2018  HPI/Events of Note  Anemia - Hgb = 6.8. Will   eICU Interventions  Will transfuse 1 Unit PRBC nowl     Intervention Category Major Interventions: Other:  Lenell AntuSommer,Steven Eugene 01/25/2018, 4:34 AM

## 2018-01-26 DIAGNOSIS — R578 Other shock: Secondary | ICD-10-CM

## 2018-01-26 LAB — CBC WITH DIFFERENTIAL/PLATELET
BASOS PCT: 0 %
Basophils Absolute: 0 10*3/uL (ref 0.0–0.1)
EOS PCT: 2 %
Eosinophils Absolute: 0.3 10*3/uL (ref 0.0–0.7)
HEMATOCRIT: 19.8 % — AB (ref 36.0–46.0)
HEMOGLOBIN: 6.9 g/dL — AB (ref 12.0–15.0)
Lymphocytes Relative: 7 %
Lymphs Abs: 0.9 10*3/uL (ref 0.7–4.0)
MCH: 31.5 pg (ref 26.0–34.0)
MCHC: 34.8 g/dL (ref 30.0–36.0)
MCV: 90.4 fL (ref 78.0–100.0)
MONOS PCT: 11 %
Monocytes Absolute: 1.4 10*3/uL — ABNORMAL HIGH (ref 0.1–1.0)
Neutro Abs: 9.9 10*3/uL — ABNORMAL HIGH (ref 1.7–7.7)
Neutrophils Relative %: 80 %
Platelets: 70 10*3/uL — ABNORMAL LOW (ref 150–400)
RBC: 2.19 MIL/uL — AB (ref 3.87–5.11)
RDW: 15.4 % (ref 11.5–15.5)
WBC: 12.5 10*3/uL — AB (ref 4.0–10.5)

## 2018-01-26 LAB — BASIC METABOLIC PANEL
Anion gap: 5 (ref 5–15)
BUN: <5 mg/dL — ABNORMAL LOW (ref 6–20)
CHLORIDE: 108 mmol/L (ref 101–111)
CO2: 24 mmol/L (ref 22–32)
CREATININE: 0.59 mg/dL (ref 0.44–1.00)
Calcium: 7.7 mg/dL — ABNORMAL LOW (ref 8.9–10.3)
GFR calc non Af Amer: >60 mL/min (ref >60)
GLUCOSE: 82 mg/dL (ref 65–99)
Potassium: 3.8 mmol/L (ref 3.5–5.1)
Sodium: 137 mmol/L (ref 135–145)

## 2018-01-26 LAB — CBC
HEMATOCRIT: 24 % — AB (ref 36.0–46.0)
Hemoglobin: 8.3 g/dL — ABNORMAL LOW (ref 12.0–15.0)
MCH: 30.7 pg (ref 26.0–34.0)
MCHC: 34.6 g/dL (ref 30.0–36.0)
MCV: 88.9 fL (ref 78.0–100.0)
Platelets: 103 10*3/uL — ABNORMAL LOW (ref 150–400)
RBC: 2.7 MIL/uL — AB (ref 3.87–5.11)
RDW: 15.4 % (ref 11.5–15.5)
WBC: 16.4 10*3/uL — AB (ref 4.0–10.5)

## 2018-01-26 LAB — PREPARE RBC (CROSSMATCH)

## 2018-01-26 MED ORDER — SODIUM CHLORIDE 0.9 % IV SOLN: Freq: Once | INTRAVENOUS | Status: DC

## 2018-01-26 MED ORDER — HYDROCODONE-ACETAMINOPHEN 5-325 MG PO TABS
1.0000 | ORAL_TABLET | ORAL | Status: DC | PRN
  Administered 2018-01-26 – 2018-01-27 (×4): 1 via ORAL
  Filled 2018-01-26 (×4): qty 1

## 2018-01-26 NOTE — Anesthesia Post-op Follow-up Note (Signed)
  Anesthesia Pain Follow-up Note  Patient: Heather Pena  Day #: 3  Date of Follow-up: 01/26/2018 Time: 8:29 PM  Last Vitals:  Vitals:   01/26/18 1900 01/26/18 2000  BP: 101/89 112/73  Pulse: (!) 102 95  Resp: (!) 22 (!) 21  Temp:    SpO2: 98% 100%    Level of Consciousness: alert  Pain: mild   Side Effects:None  Catheter Site Exam:clean, dry, no drainage     Plan: Catheter removed/tip intact at surgeon's request  [name] P [name]

## 2018-01-26 NOTE — Progress Notes (Signed)
  Pt still intubated.  Still c/o some abdominal pain but appears in less distress than last night. Denies passage of flatus.    Vitals:   01/26/18 0630 01/26/18 0645 01/26/18 0700 01/26/18 0800  BP:   (!) 99/57 (!) 99/57  Pulse: 93 99 92 88  Resp: 18 10 18 19   Temp: 99.4 F (37.4 C)  98.3 F (36.8 C)   TempSrc: Axillary  Axillary   SpO2: 100% 100% 100% 100%  Weight:      Height:        lungs:  clear to auscultation cor:    RRR Abdomen:  soft, tympanic, still very tender in all quadrants; incision intact and without erythema or exudate;  Bowel sounds in 3 quadrants.  No guarding or rebound.   ex:    no cords, SCDs on. SVE:  Vaginal exam done today, cervix normal size for pp, no blood in vagina.  Scant blood on pad with very small (<dime size) clots.     Lab Results  Component Value Date   WBC 12.5 (H) 01/26/2018   HGB 6.9 (LL) 01/26/2018   HCT 19.8 (L) 01/26/2018   MCV 90.4 01/26/2018   PLT 70 (L) 01/26/2018   Bun/Cr <5/0.59  --/--/B POS Performed at [facility], [address] [place], [place] [address]  (02/16 1318)/[place]  A/P    Post operative day 2.  Expect pt to be extubated today.  H/H dropped only 0.1 point in last 12 hours- expect that we are finally catching up to pt's blood loss.   Of note, pt did have low H/H during pregnancy and required iron transfusions twice.  She is receiving another unit of blood.   Kidneys still working well.   Abd still distended and very tender but bowel sounds are returned this am.  Ileus may need some time to resolve.  I did d/w pt that she may still need an NG tube after extubation to make sure she does not have nausea or vomiting while her bowels are recovering.      Baby to be d/ced today and will be able to come here to be with pt.  Nurses are kindly getting supplies and bassinet to assist with care. Pt is still pumping and now having some colostrum come in.

## 2018-01-26 NOTE — Progress Notes (Signed)
eLink Physician-Brief Progress Note Patient Name: Ermalinda BarriosQuanesha V Goettl DOB: 08/05/1981 MRN: [id number]   Date of Service  01/26/2018  HPI/Events of Note  Anemia - Hgb = 6.9.   eICU Interventions  Will transfuse 1 unit PRBC now.      Intervention Category Major Interventions: Other:  Nidya Bouyer Dennard Nipugene 01/26/2018, 6:15 AM

## 2018-01-26 NOTE — Progress Notes (Signed)
Baby boy with mom. Education provided to mom with latching. Fed for 10 mins.Family at bedside.Hand hygiene education provided to family.

## 2018-01-26 NOTE — Progress Notes (Signed)
Baby boy with mom. Limmie PatriciaLisa Koceja, RN from [facility]baby unit at [facility], at bedside to answer pt's questions and assist with breastfeeding. Baby latched and fed on left breast for 10 minutes.

## 2018-01-26 NOTE — Procedures (Signed)
Extubation Procedure Note  Patient Details:   Name: Heather Pena DOB: 08/23/1981 MRN: [id number]   Airway Documentation:  Airway 7 mm (Active)    Evaluation  O2 sats: stable throughout Complications: No apparent complications Patient did tolerate procedure well. Bilateral Breath Sounds: Clear   Yes   Positive cuff leak noted.  Pt placed on [place] 4 L with humidity, no stridor noted, pt able to reach 750 mL using incentive spirometer. Family at bedside.  Forest BeckerJean S Stacey Sago 01/26/2018, 11:52 AM

## 2018-01-26 NOTE — Progress Notes (Signed)
CRITICAL VALUE ALERT  Critical Value:  6.9 Hemoglobin   Date & Time Notied:  05:59am   Provider Notified: Loletha GrayerSommer, Steven  Orders Received/Actions taken: See Mar.

## 2018-01-26 NOTE — Progress Notes (Signed)
Mother fed baby of breast milk expressed during previous pumping

## 2018-01-26 NOTE — Progress Notes (Signed)
Mom pumped using home breast pump from . 5ml of colostrum pumped from each breast, totaling 

## 2018-01-26 NOTE — Progress Notes (Signed)
Pt pumped about 10cc of breastmilk/colostrum with electric pump. This was fed to baby, as well as a supplement of about 6cc of formula.

## 2018-01-26 NOTE — Addendum Note (Signed)
Addendum  created 01/26/18 2027 by Leonides GrillsEllender, Ryan P, MD   Intraprocedure Event edited, Intraprocedure LDAs edited, LDA properties accepted

## 2018-01-26 NOTE — Addendum Note (Signed)
Addendum  created 01/26/18 2029 by Leonides GrillsEllender, Ryan P, MD   Sign clinical note

## 2018-01-26 NOTE — Progress Notes (Signed)
PULMONARY / CRITICAL CARE MEDICINE   Name: Heather Pena MRN: [id number] DOB: [birth year]              PULMONARY / CRITICAL CARE MEDICINE   Name: BRION HEDGES MRN: [id number] DOB: 04/18/1981    ADMISSION DATE:  01/23/2018    REFERRING MD:  Henderson Cloud  CHIEF COMPLAINT: Post-op bleeding in setting of elective Caesarian section  HISTORY OF PRESENT ILLNESS:   The patient is a 37 yo B/F  Primigravida who ubnderwent C section and delivered baby boy. Her operative course was complicated by massive bleeding. She apparently had massively dilated myopmetrial veins and adhesions on the psterior uterine wall. The patient required  6 units of blood and was administered 4 units of FFP as well. In addition, she received about 3 additional liters of crystalloid. Transferred to [facility] on vent.   PAST MEDICAL HISTORY :  She  has a past medical history of AMA (advanced maternal age) primigravida 35+, Anemia, Asthma, Hashimoto's disease, Headache, Herpes, History of endometriosis, Hypothyroidism, Newborn product of in vitro fertilization (IVF) pregnancy, and PONV (postoperative nausea and vomiting).  PAST SURGICAL HISTORY: She  has a past surgical history that includes Colonoscopy (2012); Endometrial ablation (2009); Finger fracture surgery (2004); Diagnostic laparoscopy; Wisdom tooth extraction (Bilateral); laparoscopy (N/A, 04/20/2015); Chromopertubation (N/A, 04/20/2015); Dilatation & currettage/hysteroscopy with resectoscope (04/20/2015); Hernia repair; and Cesarean section (N/A, 01/24/2018).  Allergies  Allergen Reactions  . Metronidazole Nausea And Vomiting  . Sulfonamide Derivatives Hives    No current facility-administered medications on file prior to encounter.    Current Outpatient Medications on File Prior to Encounter  Medication Sig  . acetaminophen (TYLENOL) 500 MG tablet Take 1,000 mg by mouth every 6 (six) hours as needed for moderate pain or headache.  . polyethylene glycol  (MIRALAX / GLYCOLAX) packet Take 17 g by mouth daily.  . Prenatal Vit-Fe Fumarate-FA (PRENATAL MULTIVITAMIN) TABS tablet Take 1 tablet by mouth daily at 12 noon.  . ranitidine (ZANTAC) 150 MG tablet Take 150 mg by mouth 2 (two) times daily.  . valACYclovir (VALTREX) 500 MG tablet Take 500 mg by mouth 2 (two) times daily.  Marland Kitchen albuterol (PROVENTIL HFA;VENTOLIN HFA) 108 (90 BASE) MCG/ACT inhaler Inhale 1-2 puffs into the lungs every 6 (six) hours as needed for wheezing or shortness of breath.     FAMILY HISTORY:  Her indicated that her mother is alive. She indicated that her father is alive. She indicated that the status of her cousin is unknown.   SOCIAL HISTORY: She  reports that  has never smoked. she has never used smokeless tobacco. She reports that she drinks alcohol. She reports that she does not use drugs.  REVIEW OF SYSTEMS:   Unable to obtain as patient is intubated  SUBJECTIVE:  Weaning well, awake, alert.  VITAL SIGNS: BP 105/62   Pulse 91   Temp 98.5 F (36.9 C) (Oral)   Resp (!) 24   Ht 5' (1.524 m)   Wt 164 lb 3.9 oz (74.5 kg)   LMP 04/16/2017   SpO2 100%   Breastfeeding? Unknown   BMI 32.08 kg/m   HEMODYNAMICS:   VENTILATOR SETTINGS: Vent Mode: PSV;CPAP FiO2 (%):  [30 %] 30 % Set Rate:  [18 bmp] 18 bmp Vt Set:  [370 mL] 370 mL PEEP:  [5 cmH20] 5 cmH20 Pressure Support:  [5 cmH20] 5 cmH20 Plateau Pressure:  [11 cmH20-13 cmH20] 13 cmH20  INTAKE / OUTPUT: I/O last 3 completed shifts: In: 5174.2 [I.V.:3615.6; Blood:624.6; NG/GT:30;  IV Piggyback:904] Out: 6385 Larin.Fine[Urine:6185; Emesis/NG output:200]  PHYSICAL EXAMINATION: Gen:      No acute distress HEENT:  EOMI, sclera anicteric, ET tube Neck:     No masses; no thyromegaly Lungs:    Clear to auscultation bilaterally; normal respiratory effort CV:         Regular rate and rhythm; no murmurs Abd:   Diminished bowel sounds Ext:    No edema; adequate peripheral perfusion Skin:      Warm and dry; no  rash Neuro: alert and oriented x 3  LABS:  BMET Recent Labs  Lab 01/24/18 1154 01/25/18 0347 01/26/18 0459  NA 136 136 137  K 3.3* 4.1 3.8  CL 108 110 108  CO2 20* 21* 24  BUN 5* <5* <5*  CREATININE 0.62 0.55 0.59  GLUCOSE 159* 112* 82    Electrolytes Recent Labs  Lab 01/24/18 1154 01/25/18 0347 01/26/18 0459  CALCIUM 6.7* 6.9* 7.7*  MG 1.1*  --   --   PHOS 2.1*  --   --     CBC Recent Labs  Lab 01/25/18 0347 01/25/18 0841 01/25/18 1959 01/26/18 0459  WBC 9.3 11.4*  --  12.5*  HGB 6.8* 7.8* 7.0* 6.9*  HCT 19.5* 22.2* 20.1* 19.8*  PLT 66* 67*  --  70*    Coag's Recent Labs  Lab 01/24/18 0900 01/24/18 1154 01/25/18 0841  APTT 41* 39* 39*  INR 1.60 1.50 1.30    Sepsis Markers Recent Labs  Lab 01/24/18 1154  LATICACIDVEN 1.3    ABG Recent Labs  Lab 01/24/18 0855 01/24/18 1119 01/25/18 0406  PHART 7.441 7.357 7.357  PCO2ART 26.9* 39.8 40.0  PO2ART 299* 247.0* 145.0*    Liver Enzymes Recent Labs  Lab 01/24/18 1154  AST 19  ALT 9*  ALKPHOS 34*  BILITOT 1.6*  ALBUMIN 2.1*    Cardiac Enzymes No results for input(s): TROPONINI, PROBNP in the last 168 hours.  Glucose Recent Labs  Lab 01/24/18 1118  GLUCAP 146*    Imaging No results found.    SIGNIFICANT EVENTS: As described above in the hsitory  LINES/TUBES: ET, NGT, foley 2/16  DISCUSSION: 37 year old with hemorrhage post cesarean section.  Acute respiratory failure post op Remains intubated.  Doing well on weaning trial. Plan on extubation today  Blood loss anemia Transfuse 1 unit.  Follow CBC She has had iron deficiency in the past.  Defer iron replacement, iron studies until she is stabilized  GI, reduced bowel sounds. Likely has mild functional ileus post op Start PO clears.  Monitor for nausea, vomiting.  The patient is critically ill with multiple organ system failure and requires high complexity decision making for assessment and support, frequent  evaluation and titration of therapies, advanced monitoring, review of radiographic studies and interpretation of complex data.   Critical Care Time devoted to patient care services, exclusive of separately billable procedures, described in this note is 35  minutes.   Chilton GreathousePraveen Jayjay Littles MD [facility] Pager [phone] If no answer or after 3pm call: [phone] 01/26/2018, 2:26 PM

## 2018-01-26 NOTE — Progress Notes (Signed)
Pt pumped 17cc total of colostrum/breastmilk with electric pump around 3pm. Milk fed to baby via bottle. Baby latched again to left breast around 3:45 and fed for about 10 minutes.

## 2018-01-26 NOTE — Progress Notes (Signed)
Pumped mom's breasts via breast pump X 20 minutes. Only a very small amount from the left breast.  Tolerated well.

## 2018-01-27 ENCOUNTER — Encounter (HOSPITAL_COMMUNITY): Payer: Self-pay

## 2018-01-27 ENCOUNTER — Inpatient Hospital Stay (HOSPITAL_COMMUNITY)
Admission: AD | Admit: 2018-01-27 | Discharge: 2018-01-30 | Disposition: A | Discharge status: Home / Self Care | Payer: BLUE CROSS/BLUE SHIELD | Source: Ambulatory Visit | Attending: Obstetrics and Gynecology | Admitting: Obstetrics and Gynecology

## 2018-01-27 ENCOUNTER — Other Ambulatory Visit: Payer: Self-pay

## 2018-01-27 DIAGNOSIS — Z9889 Other specified postprocedural states: Secondary | ICD-10-CM

## 2018-01-27 DIAGNOSIS — R58 Hemorrhage, not elsewhere classified: Secondary | ICD-10-CM

## 2018-01-27 DIAGNOSIS — Z3A39 39 weeks gestation of pregnancy: Secondary | ICD-10-CM

## 2018-01-27 LAB — BASIC METABOLIC PANEL
ANION GAP: 9 (ref 5–15)
BUN: <5 mg/dL — ABNORMAL LOW (ref 6–20)
CALCIUM: 8.2 mg/dL — AB (ref 8.9–10.3)
CO2: 24 mmol/L (ref 22–32)
Chloride: 104 mmol/L (ref 101–111)
Creatinine, Ser: 0.68 mg/dL (ref 0.44–1.00)
Glucose, Bld: 84 mg/dL (ref 65–99)
Potassium: 3.5 mmol/L (ref 3.5–5.1)
SODIUM: 137 mmol/L (ref 135–145)

## 2018-01-27 LAB — CBC
HCT: 25.4 % — ABNORMAL LOW (ref 36.0–46.0)
Hemoglobin: 8.7 g/dL — ABNORMAL LOW (ref 12.0–15.0)
MCH: 30.3 pg (ref 26.0–34.0)
MCHC: 34.3 g/dL (ref 30.0–36.0)
MCV: 88.5 fL (ref 78.0–100.0)
PLATELETS: 134 10*3/uL — AB (ref 150–400)
RBC: 2.87 MIL/uL — AB (ref 3.87–5.11)
RDW: 14.7 % (ref 11.5–15.5)
WBC: 17.3 10*3/uL — AB (ref 4.0–10.5)

## 2018-01-27 LAB — BPAM RBC
BLOOD PRODUCT EXPIRATION DATE: 201903082359
BLOOD PRODUCT EXPIRATION DATE: 201903182359
Blood Product Expiration Date: 201903112359
ISSUE DATE / TIME: 201902180629
ISSUE DATE / TIME: 201902161404
ISSUE DATE / TIME: 201902170441
Unit Type and Rh: 7300
Unit Type and Rh: 7300
Unit Type and Rh: 7300

## 2018-01-27 LAB — COMPREHENSIVE METABOLIC PANEL
ALBUMIN: 2.6 g/dL — AB (ref 3.5–5.0)
ALK PHOS: 59 U/L (ref 38–126)
ALT: 17 U/L (ref 14–54)
AST: 24 U/L (ref 15–41)
Anion gap: 9 (ref 5–15)
BUN: 5 mg/dL — AB (ref 6–20)
CO2: 24 mmol/L (ref 22–32)
CREATININE: 0.65 mg/dL (ref 0.44–1.00)
Calcium: 7.9 mg/dL — ABNORMAL LOW (ref 8.9–10.3)
Chloride: 101 mmol/L (ref 101–111)
GFR calc Af Amer: >60 mL/min (ref >60)
GFR calc non Af Amer: >60 mL/min (ref >60)
GLUCOSE: 78 mg/dL (ref 65–99)
Potassium: 3.6 mmol/L (ref 3.5–5.1)
SODIUM: 134 mmol/L — AB (ref 135–145)
Total Bilirubin: 1 mg/dL (ref 0.3–1.2)
Total Protein: 5.1 g/dL — ABNORMAL LOW (ref 6.5–8.1)

## 2018-01-27 LAB — TYPE AND SCREEN
ABO/RH(D): B POS
Antibody Screen: NEGATIVE
Unit division: 0
Unit division: 0
Unit division: 0

## 2018-01-27 LAB — PHOSPHORUS: PHOSPHORUS: 3.3 mg/dL (ref 2.5–4.6)

## 2018-01-27 LAB — MAGNESIUM: MAGNESIUM: 1.3 mg/dL — AB (ref 1.7–2.4)

## 2018-01-27 MED ORDER — BISACODYL 10 MG RE SUPP: 10.0000 mg | Freq: Every day | RECTAL | Status: DC | PRN

## 2018-01-27 MED ORDER — DIBUCAINE 1 % RE OINT: 1.0000 "application " | TOPICAL_OINTMENT | RECTAL | Status: DC | PRN

## 2018-01-27 MED ORDER — TETANUS-DIPHTH-ACELL PERTUSSIS 5-2.5-18.5 LF-MCG/0.5 IM SUSP: 0.5000 mL | Freq: Once | INTRAMUSCULAR | Status: DC

## 2018-01-27 MED ORDER — MAGNESIUM SULFATE 2 GM/50ML IV SOLN
2.0000 g | Freq: Once | INTRAVENOUS | Status: AC
  Administered 2018-01-27: 2 g via INTRAVENOUS
  Filled 2018-01-27: qty 50

## 2018-01-27 MED ORDER — BENZOCAINE-MENTHOL 20-0.5 % EX AERO
1.0000 "application " | INHALATION_SPRAY | CUTANEOUS | Status: DC | PRN
  Administered 2018-01-27: 1 via TOPICAL
  Filled 2018-01-27: qty 56

## 2018-01-27 MED ORDER — SENNOSIDES-DOCUSATE SODIUM 8.6-50 MG PO TABS
2.0000 | ORAL_TABLET | ORAL | Status: DC
  Administered 2018-01-28 – 2018-01-29 (×3): 2 via ORAL
  Filled 2018-01-27 (×3): qty 2

## 2018-01-27 MED ORDER — COCONUT OIL OIL
1.0000 "application " | TOPICAL_OIL | Status: DC | PRN
  Administered 2018-01-27: 1 via TOPICAL
  Filled 2018-01-27: qty 120

## 2018-01-27 MED ORDER — LACTATED RINGERS IV SOLN
INTRAVENOUS | Status: DC
  Administered 2018-01-27: 16:00:00 via INTRAVENOUS

## 2018-01-27 MED ORDER — WITCH HAZEL-GLYCERIN EX PADS
1.0000 "application " | MEDICATED_PAD | CUTANEOUS | Status: DC | PRN
  Administered 2018-01-27: 1 via TOPICAL

## 2018-01-27 MED ORDER — PRENATAL MULTIVITAMIN CH
1.0000 | ORAL_TABLET | Freq: Every day | ORAL | Status: DC
  Administered 2018-01-28 – 2018-01-29 (×2): 1 via ORAL
  Filled 2018-01-27 (×2): qty 1

## 2018-01-27 MED ORDER — OXYCODONE HCL 5 MG PO TABS
5.0000 mg | ORAL_TABLET | ORAL | Status: DC | PRN
  Administered 2018-01-27 – 2018-01-30 (×5): 5 mg via ORAL
  Filled 2018-01-27 (×5): qty 1

## 2018-01-27 MED ORDER — METHYLERGONOVINE MALEATE 0.2 MG/ML IJ SOLN: 0.2000 mg | INTRAMUSCULAR | Status: DC | PRN

## 2018-01-27 MED ORDER — DIPHENHYDRAMINE HCL 25 MG PO CAPS: 25.0000 mg | ORAL_CAPSULE | Freq: Four times a day (QID) | ORAL | Status: DC | PRN

## 2018-01-27 MED ORDER — IBUPROFEN 800 MG PO TABS
800.0000 mg | ORAL_TABLET | Freq: Three times a day (TID) | ORAL | Status: DC
  Administered 2018-01-27 – 2018-01-29 (×8): 800 mg via ORAL
  Filled 2018-01-27 (×8): qty 1

## 2018-01-27 MED ORDER — ZOLPIDEM TARTRATE 5 MG PO TABS: 5.0000 mg | ORAL_TABLET | Freq: Every evening | ORAL | Status: DC | PRN

## 2018-01-27 MED ORDER — SIMETHICONE 80 MG PO CHEW: 80.0000 mg | CHEWABLE_TABLET | ORAL | Status: DC | PRN

## 2018-01-27 MED ORDER — MEASLES, MUMPS & RUBELLA VAC ~~LOC~~ INJ: 0.5000 mL | INJECTION | Freq: Once | SUBCUTANEOUS | Status: DC

## 2018-01-27 MED ORDER — SIMETHICONE 80 MG PO CHEW
80.0000 mg | CHEWABLE_TABLET | ORAL | Status: DC
  Administered 2018-01-28 – 2018-01-29 (×3): 80 mg via ORAL
  Filled 2018-01-27 (×3): qty 1

## 2018-01-27 MED ORDER — ACETAMINOPHEN 325 MG PO TABS
650.0000 mg | ORAL_TABLET | ORAL | Status: DC | PRN
  Administered 2018-01-27 – 2018-01-28 (×3): 650 mg via ORAL
  Filled 2018-01-27 (×3): qty 2

## 2018-01-27 MED ORDER — SIMETHICONE 80 MG PO CHEW
80.0000 mg | CHEWABLE_TABLET | Freq: Three times a day (TID) | ORAL | Status: DC
  Administered 2018-01-27 – 2018-01-30 (×7): 80 mg via ORAL
  Filled 2018-01-27 (×8): qty 1

## 2018-01-27 MED ORDER — FLEET ENEMA 7-19 GM/118ML RE ENEM: 1.0000 | ENEMA | Freq: Every day | RECTAL | Status: DC | PRN

## 2018-01-27 MED ORDER — MEASLES, MUMPS & RUBELLA VAC ~~LOC~~ INJ
0.5000 mL | INJECTION | Freq: Once | SUBCUTANEOUS | Status: DC
  Filled 2018-01-27: qty 0.5

## 2018-01-27 MED ORDER — MENTHOL 3 MG MT LOZG: 1.0000 | LOZENGE | OROMUCOSAL | Status: DC | PRN

## 2018-01-27 MED ORDER — WITCH HAZEL-GLYCERIN EX PADS: 1.0000 "application " | MEDICATED_PAD | CUTANEOUS | Status: DC | PRN

## 2018-01-27 MED ORDER — OXYCODONE HCL 5 MG PO TABS
10.0000 mg | ORAL_TABLET | ORAL | Status: DC | PRN
  Administered 2018-01-28 – 2018-01-29 (×2): 10 mg via ORAL
  Filled 2018-01-27 (×2): qty 2

## 2018-01-27 MED ORDER — METHYLERGONOVINE MALEATE 0.2 MG PO TABS: 0.2000 mg | ORAL_TABLET | ORAL | Status: DC | PRN

## 2018-01-27 NOTE — Progress Notes (Signed)
Fentanyl 125ml flushed down sink. Witnessed by Lou MinerPat Stromonski, RN

## 2018-01-27 NOTE — Progress Notes (Signed)
Patient transferred from chair to bed with 2 assist complaints of pain and weakness. Patients significant other at the bedside with baby boy. Encouraged patient to either breastfeed or pump but she states she cannot at the moment due to pain. PRN pain medications were given. Follow up pain assessment patient is laying in bed pumping with assistance from significant other with CCM at the bedside. OB/GYN has examined patient and discussed her surgical complications in detail and agreed patient may transfer to [facility] in [place].

## 2018-01-27 NOTE — Progress Notes (Signed)
eLink Physician-Brief Progress Note Patient Name: Ermalinda BarriosQuanesha V Cozza DOB: 08/02/1981 MRN: [id number]   Date of Service  01/27/2018  HPI/Events of Note  K+ = 3.5, Mg++ = 1.3 and Creatinine = 0.68. IV fluid has K+ in it.   eICU Interventions  Will replace Mg++.     Intervention Category Major Interventions: Electrolyte abnormality - evaluation and management  Isla Sabree Eugene 01/27/2018, 6:15 AM

## 2018-01-27 NOTE — Progress Notes (Signed)
Pt pumped of breastmilk/colostrum, which was fed to baby.

## 2018-01-27 NOTE — Progress Notes (Signed)
Patient belongings packed by family at the bedside, given to husband. Patient to be transported via [facility] to [facility] [place]. ELink notified. Kandis Mannanmar, NT to return breastfeeding pump and bassinet to original pediatric unit.

## 2018-01-27 NOTE — Progress Notes (Signed)
PULMONARY / CRITICAL CARE MEDICINE   Name: Heather Pena MRN: [id number] DOB: November 17, 1981              PULMONARY / CRITICAL CARE MEDICINE   Name: Heather Pena MRN: [id number] DOB: [birth year]    ADMISSION DATE:  01/23/2018    REFERRING MD:  Henderson Cloud  CHIEF COMPLAINT: Post-op bleeding in setting of elective Caesarian section  HISTORY OF PRESENT ILLNESS:   The patient is a 37 yo B/F  Primigravida who ubnderwent C section and delivered baby boy. Her operative course was complicated by massive bleeding. She apparently had massively dilated myopmetrial veins and adhesions on the psterior uterine wall. The patient required  6 units of blood and was administered 4 units of FFP as well. In addition, she received about 3 additional liters of crystalloid. Transferred to [facility] on vent.   01/27/2018 she has been extubated for 2 days.  She is ready for transfer back to [facility] off telemetry.  SUBJECTIVE:  Awake alert on room air no acute distress.  VITAL SIGNS: BP 112/78 (BP Location: Right Arm)   Pulse (!) 103   Temp 98.9 F (37.2 C) (Oral)   Resp (!) 22   Ht 5' (1.524 m)   Wt 67.6 kg (149 lb 0.5 oz)   LMP 04/16/2017   SpO2 98%   Breastfeeding? Unknown   BMI 29.11 kg/m   HEMODYNAMICS:   VENTILATOR SETTINGS:    INTAKE / OUTPUT: I/O last 3 completed shifts: In: 7270 [P.O.:600; I.V.:3145; NWGNF:6213; IV Piggyback:250] Out: 5175 [Urine:5075; Emesis/NG output:100]  PHYSICAL EXAMINATION: General: Well-nourished well-developed female in no acute distress is actively breast-feeding at this time. HEENT: No JVD lymphadenopathy is appreciated PSY: Appropriate affect Neuro: Intact CV: s1s2 rrr, no m/r/g PULM: even/non-labored, lungs bilaterally to the base GI: Tender to touch, Foley remains in place Extremities: warm/dry, negative edema  Skin: no rashes or lesions   LABS:  BMET Recent Labs  Lab 01/25/18 0347 01/26/18 0459 01/27/18 0242  NA 136 137 137   K 4.1 3.8 3.5  CL 110 108 104  CO2 21* 24 24  BUN <5* <5* <5*  CREATININE 0.55 0.59 0.68  GLUCOSE 112* 82 84    Electrolytes Recent Labs  Lab 01/24/18 1154 01/25/18 0347 01/26/18 0459 01/27/18 0242  CALCIUM 6.7* 6.9* 7.7* 8.2*  MG 1.1*  --   --  1.3*  PHOS 2.1*  --   --  3.3    CBC Recent Labs  Lab 01/26/18 0459 01/26/18 1715 01/27/18 0242  WBC 12.5* 16.4* 17.3*  HGB 6.9* 8.3* 8.7*  HCT 19.8* 24.0* 25.4*  PLT 70* 103* 134*    Coag's Recent Labs  Lab 01/24/18 0900 01/24/18 1154 01/25/18 0841  APTT 41* 39* 39*  INR 1.60 1.50 1.30    Sepsis Markers Recent Labs  Lab 01/24/18 1154  LATICACIDVEN 1.3    ABG Recent Labs  Lab 01/24/18 0855 01/24/18 1119 01/25/18 0406  PHART 7.441 7.357 7.357  PCO2ART 26.9* 39.8 40.0  PO2ART 299* 247.0* 145.0*    Liver Enzymes Recent Labs  Lab 01/24/18 1154  AST 19  ALT 9*  ALKPHOS 34*  BILITOT 1.6*  ALBUMIN 2.1*    Cardiac Enzymes No results for input(s): TROPONINI, PROBNP in the last 168 hours.  Glucose Recent Labs  Lab 01/24/18 1118  GLUCAP 146*    Imaging No results found.    SIGNIFICANT EVENTS: As described above in the hsitory  LINES/TUBES: ET, out 01/25/2018 NGT out 01/25/2018  foley 2/16  DISCUSSION: 37 year old with hemorrhage post cesarean section.  She is been extubated since 01/25/1999.  Vaginal bleeding has slowed to minimal.  She no longer needs telemetry in intensive care unit treatments.  We will plan on transfer her back to [facility] and to OB/GYN service  Acute respiratory failure post op Remains intubated.  Doing well on weaning trial. Plan on extubation 01/25/2018 2 /19/2019 she is currently on room air with no respiratory deficiency.  Blood loss anemia Recent Labs    01/26/18 1715 01/27/18 0242  HGB 8.3* 8.7*    Transfuse 1 unit.  Follow CBC She has had iron deficiency in the past.  Defer iron replacement, iron studies until she is stabilized 01/27/2018  hemoglobin remained stable  GI, reduced bowel sounds. Likely has mild functional ileus post op Start PO clears.  Monitor for nausea, vomiting. 01/27/2018 she is tolerating p.o.'s at this time.   Renal: Lab Results  Component Value Date   CREATININE 0.68 01/27/2018   CREATININE 0.59 01/26/2018   CREATININE 0.55 01/25/2018   Recent Labs  Lab 01/25/18 0347 01/26/18 0459 01/27/18 0242  K 4.1 3.8 3.5   Mag 1.3 Plan: Magnesium repleted per OB/GYN Discontinue IV fluids at this time.  Transferred back to [facility] 01/27/2018   [facility] ACNP [facility] PCCM Pager [phone] till 1 pm If no answer page [phone] 01/27/2018, 10:51 AM

## 2018-01-27 NOTE — Progress Notes (Signed)
  Patient is extubated and tolerating PO.  Pain control is good.  Vitals:   01/27/18 0600 01/27/18 0700 01/27/18 0800 01/27/18 0818  BP: 110/70 108/75 112/78   Pulse: 98 96 (!) 103   Resp:      Temp:    98.9 F (37.2 C)  TempSrc:    Oral  SpO2: 98% 99% 98%   Weight:      Height:        lungs:   clear to auscultation cor:    RRR Abdomen:  soft, appropriate tenderness, incisions intact and without erythema or exudate ex:    no cords   Lab Results  Component Value Date   WBC 17.3 (H) 01/27/2018   HGB 8.7 (L) 01/27/2018   HCT 25.4 (L) 01/27/2018   MCV 88.5 01/27/2018   PLT 134 (L) 01/27/2018    --/--/B POS Performed at [facility], [address] [place], [place] [address]  (02/16 1318)/[place]  A/P    Post operative day 3- cesarean hysterectomy.  Pt recovering from acute blood loss during surgery that necessitated hysterectomy.  Critical care still taking care of pt.  Anemia is now stable and her platelets are normal.  Pt pumping and feeding baby.     Pt is doing substantially better.  When pt is ready to come off cardiac monitoring and would be tranfered to a regular room for recovery with bowels, voiding and movement, would be happy to have pt transferred [facility] the [facility] of her stay.  I d/w pt that I did remove her uterus and L tube but not her cervix.

## 2018-01-28 LAB — TYPE AND SCREEN
ABO/RH(D): B POS
ANTIBODY SCREEN: NEGATIVE

## 2018-01-28 LAB — CBC
HCT: 26.1 % — ABNORMAL LOW (ref 36.0–46.0)
Hemoglobin: 9.1 g/dL — ABNORMAL LOW (ref 12.0–15.0)
MCH: 30.5 pg (ref 26.0–34.0)
MCHC: 34.9 g/dL (ref 30.0–36.0)
MCV: 87.6 fL (ref 78.0–100.0)
Platelets: 207 10*3/uL (ref 150–400)
RBC: 2.98 MIL/uL — ABNORMAL LOW (ref 3.87–5.11)
RDW: 14.3 % (ref 11.5–15.5)
WBC: 14 10*3/uL — AB (ref 4.0–10.5)

## 2018-01-28 MED ORDER — FAMOTIDINE 20 MG PO TABS
20.0000 mg | ORAL_TABLET | Freq: Two times a day (BID) | ORAL | Status: DC | PRN
  Administered 2018-01-28 – 2018-01-29 (×2): 20 mg via ORAL
  Filled 2018-01-28 (×2): qty 1

## 2018-01-28 NOTE — Evaluation (Signed)
Physical Therapy Evaluation Patient Details Name: Heather Pena MRN: [id number] DOB: 10/19/1981 Today's Date: 01/28/2018   History of Present Illness  Pt s/p c-section with acute blood loss necessitating hysterectomy. Pt with hemorrhagic shock and required intubation. [facility] Extubated 2/18. [facility]  Clinical Impression  Pt presents to PT with decr mobility due to weakness after critical illness. Expect pt will make good progress and be able to return home with family support. Instructed in mini squats, long arc quads, quad sets, ankle pumps. Will see again tomorrow to finalize equipment recommendations.     Follow Up Recommendations No PT follow up    Equipment Recommendations  3in1 (PT);Rolling walker with 5" wheels(may not need walker. Will make final decision 2/21)    Recommendations for Other Services       Precautions / Restrictions Precautions Precautions: None Restrictions Weight Bearing Restrictions: No      Mobility  Bed Mobility Overal bed mobility: Modified Independent             General bed mobility comments: Incr time and HOB elevated  Transfers Overall transfer level: Needs assistance Equipment used: Rolling walker (2 wheeled) Transfers: Sit to/from Stand Sit to Stand: Supervision         General transfer comment: assist for safety and initial instruction on use of walker  Ambulation/Gait Ambulation/Gait assistance: Min guard Ambulation Distance (Feet): 150 Feet Assistive device: Rolling walker (2 wheeled) Gait Pattern/deviations: Step-through pattern;Decreased stride length Gait velocity: decr Gait velocity interpretation: Below normal speed for age/gender General Gait Details: Slow measured gait with support of walker  Stairs            Wheelchair Mobility    Modified Rankin (Stroke Patients Only)       Balance Overall balance assessment: No apparent balance deficits (not formally  assessed)                                           Pertinent Vitals/Pain Pain Assessment: Faces Faces Pain Scale: Hurts little more Pain Location: abdomen and feet Pain Descriptors / Indicators: Grimacing;Guarding Pain Intervention(s): Limited activity within patient's tolerance;Monitored during session    Home Living Family/patient expects to be discharged to:: Private residence Living Arrangements: Spouse/significant other Available Help at Discharge: Family;Available 24 hours/day Type of Home: House Home Access: Stairs to enter Entrance Stairs-Rails: None Entrance Stairs-Number of Steps: 2 Home Layout: One level Home Equipment: None Additional Comments: Bed at home is very high    Prior Function Level of Independence: Independent               Hand Dominance        Extremity/Trunk Assessment   Upper Extremity Assessment Upper Extremity Assessment: Generalized weakness    Lower Extremity Assessment Lower Extremity Assessment: Generalized weakness       Communication   Communication: No difficulties  Cognition Arousal/Alertness: Awake/alert Behavior During Therapy: WFL for tasks assessed/performed Overall Cognitive Status: Within Functional Limits for tasks assessed                                        General Comments      Exercises     Assessment/Plan    PT Assessment Patient needs continued PT services  PT Problem List Decreased strength;Decreased  activity tolerance;Decreased mobility       PT Treatment Interventions DME instruction;Gait training;Functional mobility training;Therapeutic activities;Therapeutic exercise;Patient/family education    PT Goals (Current goals can be found in the Care Plan section)  Acute Rehab PT Goals Patient Stated Goal: return home. get stronger PT Goal Formulation: With patient Time For Goal Achievement: 02/04/18 Potential to Achieve Goals: Good    Frequency Min  3X/week   Barriers to discharge        Co-evaluation               AM-PAC PT "6 Clicks" Daily Activity  Outcome Measure Difficulty turning over in bed (including adjusting bedclothes, sheets and blankets)?: A Little Difficulty moving from lying on back to sitting on the side of the bed? : A Little Difficulty sitting down on and standing up from a chair with arms (e.g., wheelchair, bedside commode, etc,.)?: A Little Help needed moving to and from a bed to chair (including a wheelchair)?: A Little Help needed walking in hospital room?: A Little Help needed climbing 3-5 steps with a railing? : A Little 6 Click Score: 18    End of Session   Activity Tolerance: Patient tolerated treatment well Patient left: in bed;with call bell/phone within reach;with family/visitor present(sitting EOB)   PT Visit Diagnosis: Other abnormalities of gait and mobility (R26.89);Muscle weakness (generalized) (M62.81)    Time: 8119-1478 PT Time Calculation (min) (ACUTE ONLY): 18 min   Charges:   PT Evaluation $PT Eval Low Complexity: 1 Low     PT G Codes:        [facility] PT [id number]]   Angelina Ok [facility] 01/28/2018, 5:15 PM

## 2018-01-28 NOTE — Progress Notes (Signed)
Patient is doing well.  She is tolerating  Solids without nausea or vomiting, but does not decreased appetite.  Is not thirsty, but is trying to drink more fluid.  Foley catheter was removed over night.  Did ambulate to bathroom with fatigue.Vaginal bleeding is scant.  + flatus, no BM  Vitals:   01/27/18 2042 01/28/18 0035 01/28/18 0420 01/28/18 0800  BP: 116/65 108/65 105/61 109/65  Pulse: 100 93 88 84  Resp: 16 16 16 18   Temp: 97.8 F (36.6 C) 98.1 F (36.7 C) 98.4 F (36.9 C) 98 F (36.7 C)  TempSrc: Oral Oral Oral Oral  SpO2: 98% 99% 100% 98%  Weight:        NAD Lungs:   clear to auscultation Heart:   RRR Abdomen:  soft, appropriate tenderness, minimal distension today, incisions intact and without erythema or drainage ext:    Symmetric, 1+ edema bilaterally--significantly decreased from prior to delivery, + scds  Lab Results  Component Value Date   WBC 14.0 (H) 01/28/2018   HGB 9.1 (L) 01/28/2018   HCT 26.1 (L) 01/28/2018   MCV 87.6 01/28/2018   PLT 207 01/28/2018    --/--/B POS (02/20 0550)  A/P    37 y.o. G1P1001 POD 4  s/p cesarean hysterectomy, c/b massive blood loss, transfusion  Resp: extubated 2/18.  Off all oxygen Heme: ABLA--hgb is stable (up to 9.1 from 8.7 yesterday) --last transfusion of PRBCs was early AM 2/18. Thrombocytopenia--resolved GU: Foley catheter just removed--awaiting void.  Continue strict Is&Os GI: cont to advance diet as tolerated.  Will cont to monitor return of GI function as at high risk of ileus Pain: doing well w ibuprofen/tyelnol/ prn oxycodone Psych: Good support system (family / church)--discussed inc risk of PPD  MSK: Deconditioned following 2d ICU stay / intubation.  Will consult PT today PPx: SCDs

## 2018-01-29 NOTE — Progress Notes (Signed)
Physical Therapy Treatment/Discharge Note Patient Details Name: Heather Pena MRN: [id number] DOB: 12/01/1981 Today's Date: 01/29/2018    History of Present Illness Pt s/p c-section with acute blood loss necessitating hysterectomy. Pt with hemorrhagic shock and required intubation. Pt transferred to [facility]. Extubated 2/18. [facility].    PT Comments    Pt making good progress with mobility. Instructed pt to continue to incr activity while here and when she returns home. Instructed pt to decr use of walker as LE edema improves and strength improves. Discussed how to get into high bed and recommended looking at sturdy step stool to make it easier. Goals met. No further acute PT.   Follow Up Recommendations  No PT follow up     Equipment Recommendations  3in1 (PT);Rolling walker with 5" wheels    Recommendations for Other Services       Precautions / Restrictions Precautions Precautions: None Restrictions Weight Bearing Restrictions: No    Mobility  Bed Mobility Overal bed mobility: Modified Independent             General bed mobility comments: Incr time  Transfers Overall transfer level: Modified independent Equipment used: Rolling walker (2 wheeled);None Transfers: Sit to/from Stand Sit to Stand: Modified independent (Device/Increase time)         General transfer comment: Incr time  Ambulation/Gait Ambulation/Gait assistance: Modified independent (Device/Increase time) Ambulation Distance (Feet): 250 Feet Assistive device: Rolling walker (2 wheeled) Gait Pattern/deviations: Step-through pattern;Decreased stride length Gait velocity: decr Gait velocity interpretation: Below normal speed for age/gender General Gait Details: Slow measured gait with support of walker   Stairs            Wheelchair Mobility    Modified Rankin (Stroke Patients Only)       Balance Overall balance assessment: No apparent balance deficits (not  formally assessed)                                          Cognition Arousal/Alertness: Awake/alert Behavior During Therapy: WFL for tasks assessed/performed Overall Cognitive Status: Within Functional Limits for tasks assessed                                        Exercises      General Comments        Pertinent Vitals/Pain Pain Assessment: Faces Faces Pain Scale: Hurts little more Pain Location: abdomen Pain Descriptors / Indicators: Grimacing;Guarding Pain Intervention(s): Limited activity within patient's tolerance;Monitored during session    Home Living                      Prior Function            PT Goals (current goals can now be found in the care plan section) Progress towards PT goals: Goals met/education completed, patient discharged from PT    Frequency           PT Plan Current plan remains appropriate    Co-evaluation              AM-PAC PT "6 Clicks" Daily Activity  Outcome Measure  Difficulty turning over in bed (including adjusting bedclothes, sheets and blankets)?: A Little Difficulty moving from lying on back to sitting on the side of the bed? : A Little  Difficulty sitting down on and standing up from a chair with arms (e.g., wheelchair, bedside commode, etc,.)?: A Little Help needed moving to and from a bed to chair (including a wheelchair)?: None Help needed walking in hospital room?: None Help needed climbing 3-5 steps with a railing? : A Little 6 Click Score: 20    End of Session   Activity Tolerance: Patient tolerated treatment well Patient left: in bed;with call bell/phone within reach;with family/visitor present(sitting EOB) Nurse Communication: Mobility status PT Visit Diagnosis: Other abnormalities of gait and mobility (R26.89);Muscle weakness (generalized) (M62.81)     Time: 0830-0900 PT Time Calculation (min) (ACUTE ONLY): 30 min  Charges:  $Gait Training: 23-37  mins                    G Codes:       [facility] PT [place] 01/29/2018, 9:04 AM Suanne Marker PT [phone]

## 2018-01-29 NOTE — Discharge Summary (Signed)
Obstetric Discharge Summary Reason for Admission: induction of labor for term AMA Prenatal Procedures: NST Intrapartum Procedures: cesarean: low cervical, transverse, failed induction; supracervical hysterectomy, L salpingectomy for pp hemorrhage, intraoperative bleeding from endometriosis Postpartum Procedures: transfusion 9 units, several units FFP, albumin, platelets Complications-Operative and Postpartum: see above.  Pt intubated and in ICU for day of surgery and PO two days.  Pt needed additional blood transfusions to stablilze H/H.  POD #1 pt underwent CT of abd/pelvis to look for postoperative bleeding and CT showed only anasarca, no bleeding.  Pt received albumin and lasix to relieve anasarca.  Kidney function was always maintained.  On PO 2 pt was extubated without comp and [facility]  Over the next several days she recovered her bowel function and began to ambulate.  Pt was able to pump while intubated and start breastfeeding when extubated.   Hemoglobin  Date Value Ref Range Status  01/28/2018 9.1 (L) 12.0 - 15.0 g/dL Final   HCT  Date Value Ref Range Status  01/28/2018 26.1 (L) 36.0 - 46.0 % Final    Discharge Diagnoses: Term Pregnancy-delivered, Failed induction and post partum hemorrhage, cesarean hysterectomy  Discharge Information: Date: 01/29/2018 Activity: pelvic rest and walk several times a day and add back activity as tolerated. Diet: routine and high fiber, high iron Medications: PNV, Ibuprofen, Iron and Percocet Condition: improved Instructions: refer to practice specific booklet Discharge to: home Follow-up Information    Carrington ClampHorvath, Overton Boggus, MD. Call in 2 week(s).   Specialty:  Obstetrics and Gynecology Contact information: [address] [place] [address] [phone]           Newborn Data: Live born female  Birth Weight: 7 lb 3.2 oz (3265 g) APGAR: 10, 10  Newborn Delivery   Birth date/time:  01/24/2018  07:00:00 Delivery type:  C-Section, Low Transverse C-section categorization:  Primary     Home with mother.  Marck Mcclenny A 01/29/2018, 7:50 AM

## 2018-01-29 NOTE — Progress Notes (Signed)
  Patient is eating, ambulating, voiding.  Pain control is good.  Vitals:   01/28/18 1600 01/28/18 1950 01/29/18 0041 01/29/18 0555  BP: 114/67 109/60 (!) 108/57 (!) 101/57  Pulse: 90 81 94 63  Resp: 18 18 18 18   Temp: 98.3 F (36.8 C) 98.2 F (36.8 C) 98.4 F (36.9 C) 99.2 F (37.3 C)  TempSrc: Oral Oral Oral Oral  SpO2: 98% 99% 99% 99%  Weight:      Height:        lungs:  clear to auscultation cor:    RRR Abdomen:  soft, appropriate tenderness, incisions intact and without erythema or exudate ex:    no cords  Perineum: scant bleeding.  Lab Results  Component Value Date   WBC 14.0 (H) 01/28/2018   HGB 9.1 (L) 01/28/2018   HCT 26.1 (L) 01/28/2018   MCV 87.6 01/28/2018   PLT 207 01/28/2018    --/--/B POS (02/20 0550)/RI  A/P    Post operative day 5 from Cesarean, pp hemorrhage, supracervical hysterectomy, blood transfusions.  Now outine post op and postpartum care with PT and advancing diet and activity. Pt recovering well.  Expect d/c in next day or so.  H/H stable, pt should restart iron at home.  Percocet for pain control.

## 2018-01-29 NOTE — Care Management Note (Signed)
Case Management Note  Patient Details  Name: Heather BarriosQuanesha V Pena MRN: [id number] Date of Birth: 07/31/1981  Subjective/Objective:            40 3/[redacted] wks gestation for induction.       Failed induction, Primary c/section, Postpartum Hemorrhage, Supracervical Hysterectomy, Left Salpingectomy. See MD DC Summary for detail of events.  Action/Plan: Per PT recommendations, will need 3-N-1 and rolling walker with 5" wheels.  Expected Discharge Date:   01/30/18               Expected Discharge Plan:  Home with DME  In-House Referral:  NA  Discharge planning Services  CM Consult  Post Acute Care Choice:  Durable Medical Equipment Choice offered to:  NA  DME Arranged:  3-N-1, Walker rolling DME Agency:  [facility]  HH Arranged:  NA HH Agency:  NA  Status of Service:  Completed, signed off  Additional Comments:   Case Manager notified by PT that Pt will need DME at dc.  CM reviewed chart and called and spoke with Dr. Henderson CloudHorvath regarding PT recommendations.  MD in agreement with DME - order placed for DME.  Ref[facility] made to [facility] and that Pt will likely go home tomorrow 01/30/18.  Clydie BraunKaren stated that DME would be delivered by tomorrow morning. CM spoke with the Pt and her family at bedside in room 316.  Discussed DME and PT recommendations - Pt aware.  CM explained that MD aware and in agreement.  Also explained that [facility] and that the DME should be delivered by tomorrow morning prior to dc.  CM will follow up in am to verify that DME had been delivered.  Family wanted to know if CM knew a time of delivery and we do not have a specific time and they voiced understanding. Pt very appreciative of CM's assistance.  Encourage Pt to call CM if there were any questions and CM would follow up in am.   Richmond CampbellJohnson, Bernard Slayden Baker, RNBSN   [phone] 01/29/2018, 2:15 PM

## 2018-01-29 NOTE — Lactation Note (Signed)
Lactation Consultation Note  Patient Name: Heather BarriosQuanesha V Pena [id number]'BToday's Date: 01/29/2018  Mom is pumping every 3 hours and last obtained 60 mls.  She is requesting assist with latch.  Baby awake and showing feeding cues.  Assisted with positioning baby in cross cradle hold on right breast.  Baby latched easily and suckled off and on for 10 minutes. Burped baby and positioned in cross cradle on left.  Baby again latched well and continued to feed when we left room.  Instructed to breastfeed with cues, post pump x 15 minutes and offer baby expressed breast milk.  Encouraged to call for assist prn.   Maternal Data    Feeding    LATCH Score                   Interventions    Lactation Tools Discussed/Used     Consult Status      Huston FoleyMOULDEN, Emiah Pellicano S 01/29/2018, 1:32 PM

## 2018-01-30 MED ORDER — IBUPROFEN 600 MG PO TABS: 600.0000 mg | ORAL_TABLET | Freq: Four times a day (QID) | ORAL | 0 refills | Status: DC | PRN

## 2018-01-30 MED ORDER — OXYCODONE-ACETAMINOPHEN 5-325 MG PO TABS: 1.0000 | ORAL_TABLET | Freq: Four times a day (QID) | ORAL | 0 refills | Status: DC | PRN

## 2018-01-30 NOTE — Progress Notes (Signed)
Discharge instructions discussed with patient including follow up appointments, medication, postpartum care and postoperative care.   Paperwork signed at this time.

## 2018-01-30 NOTE — Care Management Note (Signed)
Case Management Note  Patient Details  Name: Heather Pena MRN: [id number] Date of Birth: 02/25/1981  DME Arranged:  3-N-1, Walker rolling DME Agency:  [facility]  Additional Comments: Case Manager spoke with Pt at bedside in room 316 [facility].  The DME was delivered yesterday afternoon.  Pt had no questions and very appreciative. CM available to assist as needed.  Roseanne RenoJohnson, Donoven Pett Rutherford CollegeBaker, [place]RNBSN   [phone] 01/30/2018, 9:32 AM

## 2018-02-13 LAB — BLOOD GAS, ARTERIAL
ACID-BASE DEFICIT: 5 mmol/L — AB (ref 0.0–2.0)
Bicarbonate: 18 mmol/L — ABNORMAL LOW (ref 20.0–28.0)
Drawn by: 14770
FIO2: 0.72
LHR: 12 {breaths}/min
O2 Saturation: 99.9 %
PCO2 ART: 26.9 mmHg — AB (ref 32.0–48.0)
PEEP/CPAP: 5 cmH2O
VT: 540 mL
pH, Arterial: 7.441 (ref 7.350–7.450)
pO2, Arterial: 299 mmHg — ABNORMAL HIGH (ref 83.0–108.0)

## 2018-02-16 ENCOUNTER — Other Ambulatory Visit: Payer: Self-pay | Admitting: Obstetrics and Gynecology

## 2018-02-16 DIAGNOSIS — K829 Disease of gallbladder, unspecified: Secondary | ICD-10-CM

## 2018-02-17 ENCOUNTER — Ambulatory Visit
Admission: RE | Admit: 2018-02-17 | Discharge: 2018-02-17 | Disposition: A | Discharge status: Home / Self Care | Payer: BLUE CROSS/BLUE SHIELD | Source: Ambulatory Visit | Attending: Obstetrics and Gynecology | Admitting: Obstetrics and Gynecology

## 2018-02-17 DIAGNOSIS — K829 Disease of gallbladder, unspecified: Secondary | ICD-10-CM

## 2018-02-25 ENCOUNTER — Encounter: Payer: Self-pay | Admitting: Hematology

## 2018-02-25 ENCOUNTER — Telehealth: Payer: Self-pay | Admitting: Hematology

## 2018-02-25 NOTE — Telephone Encounter (Signed)
Appt has been scheduled for the pt to see Dr. Mosetta PuttFeng on 4/1 at 245pm. Pt aware to arrive 30 minutes early. Letter mailed to the pt and faxed to the referring office.

## 2018-02-27 ENCOUNTER — Other Ambulatory Visit: Payer: Self-pay | Admitting: General Surgery

## 2018-02-27 ENCOUNTER — Ambulatory Visit
Admission: RE | Admit: 2018-02-27 | Discharge: 2018-02-27 | Disposition: A | Discharge status: Home / Self Care | Payer: BLUE CROSS/BLUE SHIELD | Source: Ambulatory Visit | Attending: General Surgery | Admitting: General Surgery

## 2018-02-27 DIAGNOSIS — G8918 Other acute postprocedural pain: Secondary | ICD-10-CM

## 2018-02-27 DIAGNOSIS — R109 Unspecified abdominal pain: Principal | ICD-10-CM

## 2018-02-27 MED ORDER — IOPAMIDOL (ISOVUE-300) INJECTION 61%
100.0000 mL | Freq: Once | INTRAVENOUS | Status: AC | PRN
  Administered 2018-02-27: 100 mL via INTRAVENOUS

## 2018-03-07 NOTE — Progress Notes (Signed)
[facility]  Telephone:([phone] [phone]  Clinic New Consult Note   Patient Care Team: Alan MulderMorayati, Shamil J, MD as PCP - General (Endocrinology) Osborn Cohooberts, Angela, MD as Consulting Physician (Obstetrics and Gynecology) 03/09/2018  CHIEF COMPLAINTS/PURPOSE OF CONSULTATION:  Thrombocytopenia and Anemia   HISTORY OF PRESENTING ILLNESS:  Heather BarriosQuanesha V Pena 37 y.o. female is here because of excessive bleeding, thrombocytosis, and iron deficiency anemia. She was referred by internal medicine Dr. Nicholos Johnsamachandran. She is accompanied by her friend.   Pt was admitted to the hospital for a Cesarean Section on 01/23/18. She underwent a supracervical hysterectomy and a left salpingectomy due to a hemorrhage and intraoperative bleeding from Endometriosis. She was given 9 units of blood and platelets. She was discharge on 01/29/18. Her platelets were 37 after birth on 01/24/18 and recovered to normal limits on 01/28/18. Her GYN believes the cause of this episode was due to her Stage IV Endometriosis and the lesions would not stop bleeding.   Prior to her C-section, she was anemic sporadically throughout her adult life, treated with OTC iron pill. During her pregnancy she was anemic and received 2 doses of IV iron in Jan 2019. After her admission, she was discharged on oral iron TID. No other infusion. She has been taking Miralax for constipation. She has no FHx of anemia or other blood disorders. She denies FMHx of CA as well.   Currently she is still taking the prenatal vitamin and she takes Synthroid. She has had laproscopic surgeries for Endometriosis in the past and a umbilical hernia repair. She does not use tobacco and endorses rare alcohol use.   She reports that her son is almost 97 weeks old and doing well. He is healthy. He is her only child.   MEDICAL HISTORY:  Past Medical History:  Diagnosis Date  . AMA (advanced maternal age) primigravida 35+   . Anemia   . Asthma   .  Hashimoto's disease   . Headache   . Herpes   . History of endometriosis   . Hypothyroidism   . Newborn product of in vitro fertilization (IVF) pregnancy   . PONV (postoperative nausea and vomiting)     SURGICAL HISTORY: Past Surgical History:  Procedure Laterality Date  . ABDOMINAL HYSTERECTOMY    . CESAREAN SECTION N/A 01/24/2018   Procedure: CESAREAN SECTION;  Surgeon: Carrington ClampHorvath, Michelle, MD;  [facility];  Service: Obstetrics;  Laterality: N/A;  . CHROMOPERTUBATION N/A 04/20/2015   Procedure: CHROMOPERTUBATION;  Surgeon: Osborn CohoAngela Roberts, MD;  Location: [facility];  Service: Gynecology;  Laterality: N/A;  . COLONOSCOPY  2012  . DIAGNOSTIC LAPAROSCOPY     2012, and 2009  . DILATATION & CURRETTAGE/HYSTEROSCOPY WITH RESECTOCOPE  04/20/2015   Procedure: DILATATION & CURETTAGE/HYSTEROSCOPY WITH RESECTOCOPE;  Surgeon: Osborn CohoAngela Roberts, MD;  Location: [facility];  Service: Gynecology;;  . ENDOMETRIAL ABLATION  2009   Dr Seymour BarsLavoie: Da Vinci-assisted lysis of adhesions for conservative  . FINGER FRACTURE SURGERY  2004  . HERNIA REPAIR    . LAPAROSCOPY N/A 04/20/2015   Procedure: LAPAROSCOPY OPERATIVE;  Surgeon: Osborn CohoAngela Roberts, MD;  Location: [facility];  Service: Gynecology;  Laterality: N/A;  . WISDOM TOOTH EXTRACTION Bilateral     SOCIAL HISTORY: Social History   Socioeconomic History  . Marital status: Married    Spouse name: Not on file  . Number of children: Not on file  . Years of education: Not on file  . Highest education level: Not on file  Occupational History  .  Not on file  Social Needs  . Financial resource strain: Not on file  . Food insecurity:    Worry: Not on file    Inability: Not on file  . Transportation needs:    Medical: Not on file    Non-medical: Not on file  Tobacco Use  . Smoking status: Never Smoker  . Smokeless tobacco: Never Used  Substance and Sexual Activity  . Alcohol use: Yes    Comment: social   . Drug use: No  . Sexual activity: Not  Currently  Lifestyle  . Physical activity:    Days per week: Not on file    Minutes per session: Not on file  . Stress: Not on file  Relationships  . Social connections:    Talks on phone: Not on file    Gets together: Not on file    Attends religious service: Not on file    Active member of club or organization: Not on file    Attends meetings of clubs or organizations: Not on file    Relationship status: Not on file  . Intimate partner violence:    Fear of current or ex partner: Not on file    Emotionally abused: Not on file    Physically abused: Not on file    Forced sexual activity: Not on file  Other Topics Concern  . Not on file  Social History Narrative  . Not on file    FAMILY HISTORY: Family History  Problem Relation Age of Onset  . Birth defects Cousin        heart born on opposite side  . Hypertension Maternal Grandfather     ALLERGIES:  is allergic to metronidazole and sulfonamide derivatives.  MEDICATIONS:  Current Outpatient Medications  Medication Sig Dispense Refill  . albuterol (PROVENTIL HFA;VENTOLIN HFA) 108 (90 BASE) MCG/ACT inhaler Inhale 1-2 puffs into the lungs every 6 (six) hours as needed for wheezing or shortness of breath.     Marland Kitchen ibuprofen (ADVIL,MOTRIN) 800 MG tablet Take 800 mg by mouth 3 (three) times daily.  4  . levothyroxine (SYNTHROID) 25 MCG tablet Synthroid 25 mcg tablet    . polyethylene glycol (MIRALAX / GLYCOLAX) packet Take 17 g by mouth daily.    . Prenatal Vit-Fe Fumarate-FA (PRENATAL MULTIVITAMIN) TABS tablet Take 1 tablet by mouth daily at 12 noon.     No current facility-administered medications for this visit.     REVIEW OF SYSTEMS:   Constitutional: Denies fevers, chills or abnormal night sweats Eyes: Denies blurriness of vision, double vision or watery eyes Ears, nose, mouth, throat, and face: Denies mucositis or sore throat Respiratory: Denies cough, dyspnea or wheezes Cardiovascular: Denies palpitation, chest  discomfort or lower extremity swelling Gastrointestinal:  Denies nausea, heartburn or change in bowel habits Skin: Denies abnormal skin rashes Lymphatics: Denies new lymphadenopathy or easy bruising Neurological:Denies numbness, tingling or new weaknesses Behavioral/Psych: Mood is stable, no new changes  All other systems were reviewed with the patient and are negative.  PHYSICAL EXAMINATION:  Vitals:   03/09/18 1456  BP: 115/68  Pulse: 70  Resp: 17  Temp: 98.5 F (36.9 C)  SpO2: 100%   Filed Weights   03/09/18 1456  Weight: 113 lb (51.3 kg)    GENERAL:alert, no distress and comfortable SKIN: skin color, texture, turgor are normal, no rashes or significant lesions EYES: normal, conjunctiva are pink and non-injected, sclera clear OROPHARYNX:no exudate, no erythema and lips, buccal mucosa, and tongue normal  NECK: supple, thyroid  normal size, non-tender, without nodularity LYMPH:  no palpable lymphadenopathy in the cervical, axillary or inguinal LUNGS: clear to auscultation and percussion with normal breathing effort HEART: regular rate & rhythm and no murmurs and no lower extremity edema ABDOMEN:abdomen soft, non-tender and normal bowel sounds (+) Surgical incision in the low suprapubic area id healing well. No erythema or discharge. No splenomegaly. She has mild tenderness on the right side of abdomen that is likely related to surgery.  Musculoskeletal:no cyanosis of digits and no clubbing  PSYCH: alert & oriented x 3 with fluent speech NEURO: no focal motor/sensory deficits  LABORATORY DATA:  I have reviewed the data as listed CBC Latest Ref Rng & Units 01/28/2018 01/27/2018 01/26/2018  WBC 4.0 - 10.5 K/uL 14.0(H) 17.3(H) 16.4(H)  Hemoglobin 12.0 - 15.0 g/dL 1.6(X) 0.9(U) 8.3(L)  Hematocrit 36.0 - 46.0 % 26.1(L) 25.4(L) 24.0(L)  Platelets 150 - 400 K/uL 207 134(L) 103(L)    CMP Latest Ref Rng & Units 01/27/2018 01/27/2018 01/26/2018  Glucose 65 - 99 mg/dL 78 84 82  BUN 6 -  20 mg/dL 5(L) <0(A) <5(W)  Creatinine 0.44 - 1.00 mg/dL 0.98 1.19 1.47  Sodium 135 - 145 mmol/L 134(L) 137 137  Potassium 3.5 - 5.1 mmol/L 3.6 3.5 3.8  Chloride 101 - 111 mmol/L 101 104 108  CO2 22 - 32 mmol/L 24 24 24   Calcium 8.9 - 10.3 mg/dL 7.9(L) 8.2(L) 7.7(L)  Total Protein 6.5 - 8.1 g/dL 5.1(L) - -  Total Bilirubin 0.3 - 1.2 mg/dL 1.0 - -  Alkaline Phos 38 - 126 U/L 59 - -  AST 15 - 41 U/L 24 - -  ALT 14 - 54 U/L 17 - -     RADIOGRAPHIC STUDIES: I have personally reviewed the radiological images as listed and agreed with the findings in the report. Ct Abdomen Pelvis W Contrast  Result Date: 02/28/2018 CLINICAL DATA:  Postoperative abdominal pain, recent hysterectomy for post C-section bleeding. EXAM: CT ABDOMEN AND PELVIS WITH CONTRAST TECHNIQUE: Multidetector CT imaging of the abdomen and pelvis was performed using the standard protocol following bolus administration of intravenous contrast. CONTRAST:  ISOVUE-300 IOPAMIDOL (ISOVUE-300) INJECTION 61% COMPARISON:  01/25/2018. FINDINGS: Lower chest: Trace left pleural fluid. Heart is mildly enlarged. No pericardial effusion. Hepatobiliary: Subcentimeter low-attenuation lesions in the liver, as before, too small to characterize. Liver and gallbladder are otherwise grossly unremarkable. There may be mild periportal edema. No definite biliary ductal dilatation. Pancreas: Negative. Spleen: Negative. Adrenals/Urinary Tract: Adrenal glands and kidneys are unremarkable. Ureters are decompressed. Bladder is grossly unremarkable. Stomach/Bowel: Stomach is decompressed. There are decompressed and fluid-filled loops of small bowel. A focal collection of fluid inferior to the right hepatic lobe (series 2, image 43) is difficult to localize inside or outside bowel. Appendix is not readily visualized. Colon is grossly unremarkable. Vascular/Lymphatic: Vascular structures are unremarkable. Bilateral iliac chain lymph nodes measure up to 1.3 cm in the  right external iliac chain, similar. No occult mesenteric lymph nodes measure up to 8 mm. Reproductive: Hysterectomy. Heterogeneous fluid with areas of high attenuation are seen in the central anatomic pelvis. Other: Small ascites with associated peritoneal hyper attenuation. Musculoskeletal: No worrisome lytic or sclerotic lesions. IMPRESSION: 1. Complex fluid and/or phlegmon within the central anatomic pelvis, status post recent hysterectomy. Additional loculated areas of fluid in the right abdomen are difficult to localize (internal or external to bowel loops). Difficult to definitively exclude an abscess or abscesses. 2. Small ascites with peritoneal hyper attenuation, indicative of peritonitis. 3.  Trace left pleural fluid. 4. Mild adenopathy in the ileocolic mesentery and iliac chains, likely reactive. Electronically Signed   By: Leanna Battles M.D.   On: 02/28/2018 14:14   US Abdomen Limited Ruq  Result Date: 02/17/2018 CLINICAL DATA:  Right upper quadrant pain. EXAM: ULTRASOUND ABDOMEN LIMITED RIGHT UPPER QUADRANT COMPARISON:  CT 01/25/2018.  KUB 01/24/2018. FINDINGS: Gallbladder: Gallstones noted. Sludge in the gallbladder noted. Gallbladder wall is thickened at 6.5 mm. Common bile duct: Diameter: 5.3 mm Liver: Increased echogenicity suggesting fatty infiltration and/or hepatocellular disease. Portal vein is patent on color Doppler imaging with normal direction of blood flow towards the liver. Ascites noted. IMPRESSION: Sludge noted in the gallbladder. Thickened gallbladder wall at 6.5 mm. Thick gallbladder wall may be secondary to hypoproteinemia. Ascites is present. Cholecystitis cannot be excluded. Electronically Signed   By: Maisie Fus  Register   On: 02/17/2018 12:05    ASSESSMENT & PLAN:  Heather Pena 37 y.o. female who was referred to me because of excessive bleeding, thrombocytopenia, and iron deficiency anemia during/after Cesarean Section. She does have a PMHx of intermittent mild anemia  that was worse during pregnancy.   1. Anemia, secondary to bleeding and iron deficiency  - She is s/p Cesarean Section on 01/23/18 with supracervical hysterectomy and a left salpingectomy due to a hemorrhage and intraoperative bleeding from Endometriosis. She was given 9 units of blood and platelets. -She had anemic of iron deficency during her pregnancy and she received 2 doses of IV iron in Jan 2019 under her PCP  - I will recheck labs today including CBC, ferritin, iron level, TIBC, PT and APTT. If they are normal, she can just follow up with Dr. Nicholos Johns, her PCP at Internal medicine because they can do IV iron as well.  - I will call her to discuss her lab results -Continue oral iron TID for now for 3-6 months  -Since she has had a hysterectomy, I do not anticipate she will have iron deficient anemia again.   2. Thrombocytopenia, secondary massive blood loss and blood transfusion  -Her platelets was normal before delivery, and went down to 37K after C-section on 01/24/18 and recovered to normal limits on 01/28/18. -Her transit thrombocytopenia is likely secondary to her massive blood loss and blood transfusion  -Will recheck on CBC today  3. Constipation - She is aware that her oral iron pill can cause Constipation. She states she has a bowel movement every couple of days.She is using Miralax for this and I advised her that she can increase this to multiple times per day.   4. Massive bleeding  -Her message of bleeding during cesarean section is likely due to endometriosis and poorly contracted uterus, which required a hysterectomy -She never had history of bleeding from previous procedure or surgery, no clinical suspicion for bleeding disorder. -I will check PT and APTT today.  Normal, no further workup for now.  PLAN: -labs today including CBC, ferritin, iron level, TIBC, PT and APTT. If they are normal, she can just follow up with Dr. Nicholos Johns, her PCP at Internal medicine  because they can do IV iron as well.  - I will call her to discuss her lab results -Continue Oral iron TID for now, and stop when her level is adequate  -I will see her as needed in future     No problem-specific Assessment & Plan notes found for this encounter.  All questions were answered. The patient knows to call the clinic with any problems, questions  or concerns. I spent 30 minutes counseling the patient face to face. The total time spent in the appointment was 40 minutes and more than 50% was on counseling.   This document serves as a record of services personally performed by Malachy Mood, MD. It was created on her behalf by Rosana Fret, a trained medical scribe. The creation of this record is based on the scribe's personal observations and the provider's statements to them.   I have reviewed the above documentation for accuracy and completeness, and I agree with the above.    Malachy Mood, MD 03/09/18

## 2018-03-09 ENCOUNTER — Inpatient Hospital Stay: Payer: BLUE CROSS/BLUE SHIELD | Attending: Hematology | Admitting: Hematology

## 2018-03-09 ENCOUNTER — Encounter: Payer: Self-pay | Admitting: Hematology

## 2018-03-09 ENCOUNTER — Telehealth: Payer: Self-pay | Admitting: Hematology

## 2018-03-09 ENCOUNTER — Ambulatory Visit: Payer: BLUE CROSS/BLUE SHIELD

## 2018-03-09 DIAGNOSIS — D696 Thrombocytopenia, unspecified: Secondary | ICD-10-CM | POA: Diagnosis not present

## 2018-03-09 DIAGNOSIS — R58 Hemorrhage, not elsewhere classified: Secondary | ICD-10-CM | POA: Diagnosis not present

## 2018-03-09 DIAGNOSIS — D509 Iron deficiency anemia, unspecified: Secondary | ICD-10-CM | POA: Diagnosis not present

## 2018-03-09 DIAGNOSIS — D6959 Other secondary thrombocytopenia: Secondary | ICD-10-CM

## 2018-03-09 DIAGNOSIS — K5900 Constipation, unspecified: Secondary | ICD-10-CM | POA: Diagnosis not present

## 2018-03-09 DIAGNOSIS — D500 Iron deficiency anemia secondary to blood loss (chronic): Secondary | ICD-10-CM

## 2018-03-09 NOTE — Telephone Encounter (Signed)
Scheduled appt per 4/1 los - f/u open - Gave patient AVS

## 2018-03-11 ENCOUNTER — Inpatient Hospital Stay: Payer: BLUE CROSS/BLUE SHIELD

## 2018-03-11 DIAGNOSIS — D509 Iron deficiency anemia, unspecified: Secondary | ICD-10-CM | POA: Diagnosis not present

## 2018-03-11 DIAGNOSIS — D500 Iron deficiency anemia secondary to blood loss (chronic): Secondary | ICD-10-CM

## 2018-03-11 DIAGNOSIS — D6959 Other secondary thrombocytopenia: Secondary | ICD-10-CM

## 2018-03-11 LAB — CBC WITH DIFFERENTIAL (CANCER CENTER ONLY)
BASOS ABS: 0 10*3/uL (ref 0.0–0.1)
BASOS PCT: 0 %
EOS ABS: 0.3 10*3/uL (ref 0.0–0.5)
EOS PCT: 3 %
HCT: 29.8 % — ABNORMAL LOW (ref 34.8–46.6)
Hemoglobin: 9.2 g/dL — ABNORMAL LOW (ref 11.6–15.9)
Lymphocytes Relative: 11 %
Lymphs Abs: 1.1 10*3/uL (ref 0.9–3.3)
MCH: 27.5 pg (ref 25.1–34.0)
MCHC: 30.9 g/dL — ABNORMAL LOW (ref 31.5–36.0)
MCV: 89 fL (ref 79.5–101.0)
Monocytes Absolute: 0.8 10*3/uL (ref 0.1–0.9)
Monocytes Relative: 8 %
Neutro Abs: 7.8 10*3/uL — ABNORMAL HIGH (ref 1.5–6.5)
Neutrophils Relative %: 78 %
PLATELETS: 440 10*3/uL — AB (ref 145–400)
RBC: 3.35 MIL/uL — ABNORMAL LOW (ref 3.70–5.45)
RDW: 14.3 % (ref 11.2–14.5)
WBC: 10 10*3/uL (ref 3.9–10.3)

## 2018-03-11 LAB — IRON AND TIBC
Iron: 15 ug/dL — ABNORMAL LOW (ref 41–142)
SATURATION RATIOS: 8 % — AB (ref 21–57)
TIBC: 200 ug/dL — ABNORMAL LOW (ref 236–444)
UIBC: 185 ug/dL

## 2018-03-11 LAB — RETICULOCYTES
RBC.: 3.35 MIL/uL — AB (ref 3.70–5.45)
RETIC CT PCT: 1.8 % (ref 0.7–2.1)
Retic Count, Absolute: 60.3 10*3/uL (ref 33.7–90.7)

## 2018-03-11 LAB — PROTIME-INR
INR: 1.1
PROTHROMBIN TIME: 14.1 s (ref 11.4–15.2)

## 2018-03-11 LAB — FERRITIN: FERRITIN: 419 ng/mL — AB (ref 9–269)

## 2018-03-11 LAB — APTT: aPTT: 37 seconds — ABNORMAL HIGH (ref 24–36)

## 2018-03-11 LAB — SAVE SMEAR

## 2018-03-13 ENCOUNTER — Other Ambulatory Visit: Payer: Self-pay | Admitting: Hematology

## 2018-03-13 ENCOUNTER — Telehealth: Payer: Self-pay

## 2018-03-13 DIAGNOSIS — D500 Iron deficiency anemia secondary to blood loss (chronic): Secondary | ICD-10-CM

## 2018-03-13 NOTE — Telephone Encounter (Signed)
Attempted to call office. They closed at Friday at noon.

## 2018-03-13 NOTE — Telephone Encounter (Signed)
-----   Message from Malachy MoodYan Feng, MD sent at 03/13/2018  1:59 PM EDT ----- I have called pt and discussed the results. Steward DroneBrenda, please call Dr. Carolyn Stareamachandran's office and make sure the following message is received. Thanks.  Dr. Nicholos Johnsamachandran, could you give her one more dose iv iron? She had one in your office during her pregnancy. She prefers to have one in your office.  I will see her back in 3 month with lab, if her anemia does not resolved in 3 month, I will do more lab work for anemia. Thanks much.  Malachy MoodYan Feng  03/13/2018

## 2018-03-16 ENCOUNTER — Telehealth: Payer: Self-pay | Admitting: *Deleted

## 2018-03-16 NOTE — Telephone Encounter (Signed)
Called & spoke with Dr Carolyn Stareamachandran's office personnel to see if Dr Nicholos Johnsamachandran will scheduled iron infusion for this pt at his office per pt's request.

## 2018-03-17 NOTE — Progress Notes (Signed)
[facility] @ Dr. Hazeline JunkerA. Ramachandran's office.  Was informed that pt had used her available copay assistance x 2 for this year.  Pt has no assistance left.  Her out of pocket copay for IV Iron will be  $717.00.    Harriett Sineancy stated she will contact pt to let her know of her copay.   Harriett SineNancy requested lab results to be faxed to Dr. Nicholos Johnsamachandran for review.  Results faxed per request. Nancy's    Phone     [phone]     ;     Fax     [phone].

## 2018-06-15 ENCOUNTER — Inpatient Hospital Stay: Payer: BLUE CROSS/BLUE SHIELD | Attending: Hematology

## 2018-06-15 DIAGNOSIS — D500 Iron deficiency anemia secondary to blood loss (chronic): Secondary | ICD-10-CM | POA: Insufficient documentation

## 2018-06-15 DIAGNOSIS — D696 Thrombocytopenia, unspecified: Secondary | ICD-10-CM | POA: Insufficient documentation

## 2018-06-17 ENCOUNTER — Inpatient Hospital Stay: Payer: BLUE CROSS/BLUE SHIELD | Admitting: Hematology

## 2018-06-19 ENCOUNTER — Telehealth: Payer: Self-pay | Admitting: Hematology

## 2018-06-19 NOTE — Telephone Encounter (Signed)
appt r/s per 7/11 sch msg/ Letter Calendar mailed to patient

## 2018-07-08 ENCOUNTER — Inpatient Hospital Stay: Payer: BLUE CROSS/BLUE SHIELD

## 2018-07-08 DIAGNOSIS — D696 Thrombocytopenia, unspecified: Secondary | ICD-10-CM | POA: Diagnosis not present

## 2018-07-08 DIAGNOSIS — D500 Iron deficiency anemia secondary to blood loss (chronic): Secondary | ICD-10-CM

## 2018-07-08 DIAGNOSIS — D509 Iron deficiency anemia, unspecified: Secondary | ICD-10-CM | POA: Diagnosis present

## 2018-07-08 LAB — CBC WITH DIFFERENTIAL (CANCER CENTER ONLY)
Basophils Absolute: 0 10*3/uL (ref 0.0–0.1)
Basophils Relative: 1 %
EOS PCT: 6 %
Eosinophils Absolute: 0.2 10*3/uL (ref 0.0–0.5)
HEMATOCRIT: 38.7 % (ref 34.8–46.6)
Hemoglobin: 12.7 g/dL (ref 11.6–15.9)
LYMPHS PCT: 37 %
Lymphs Abs: 1.2 10*3/uL (ref 0.9–3.3)
MCH: 28.2 pg (ref 25.1–34.0)
MCHC: 32.7 g/dL (ref 31.5–36.0)
MCV: 86.2 fL (ref 79.5–101.0)
MONOS PCT: 13 %
Monocytes Absolute: 0.4 10*3/uL (ref 0.1–0.9)
Neutro Abs: 1.5 10*3/uL (ref 1.5–6.5)
Neutrophils Relative %: 43 %
Platelet Count: 244 10*3/uL (ref 145–400)
RBC: 4.49 MIL/uL (ref 3.70–5.45)
RDW: 13.9 % (ref 11.2–14.5)
WBC: 3.3 10*3/uL — AB (ref 3.9–10.3)

## 2018-07-08 LAB — IRON AND TIBC
Iron: 70 ug/dL (ref 41–142)
SATURATION RATIOS: 28 % (ref 21–57)
TIBC: 251 ug/dL (ref 236–444)
UIBC: 181 ug/dL

## 2018-07-08 LAB — FERRITIN: Ferritin: 170 ng/mL (ref 11–307)

## 2018-07-09 NOTE — Progress Notes (Signed)
[facility]  [phone] [phone]  Clinic Follow-up Note   Patient Care Team: Georgianne Fickamachandran, Ajith, MD as PCP - General (Internal Medicine) Osborn Cohooberts, Angela, MD as Consulting Physician (Obstetrics and Gynecology) 07/13/2018  CHIEF COMPLAINTS:  Thrombocytopenia and Anemia   HISTORY OF PRESENTING ILLNESS:  Heather Pena 37 y.o. female is here because of excessive bleeding, thrombocytosis, and iron deficiency anemia. She was referred by internal medicine Dr. Nicholos Johnsamachandran. She is accompanied by her friend.   Pt was admitted to the hospital for a Cesarean Section on 01/23/18. She underwent a supracervical hysterectomy and a left salpingectomy due to a hemorrhage and intraoperative bleeding from Endometriosis. She was given 9 units of blood and platelets. She was discharge on 01/29/18. Her platelets were 37 after birth on 01/24/18 and recovered to normal limits on 01/28/18. Her GYN believes the cause of this episode was due to her Stage IV Endometriosis and the lesions would not stop bleeding.   Prior to her C-section, she was anemic sporadically throughout her adult life, treated with OTC iron pill. During her pregnancy she was anemic and received 2 doses of IV iron in Jan 2019. After her admission, she was discharged on oral iron TID. No other infusion. She has been taking Miralax for constipation. She has no FHx of anemia or other blood disorders. She denies FMHx of CA as well.   Currently she is still taking the prenatal vitamin and she takes Synthroid. She has had laproscopic surgeries for Endometriosis in the past and a umbilical hernia repair. She does not use tobacco and endorses rare alcohol use.   She reports that her son is almost 497 weeks old and doing well. He is healthy. He is her only child.   CURRENT THERAPY: oral iron pill once daily    INTERVAL HISTORY Heather Pena is a 37 y.o. female who is here for follow up for her anemia. She is  here with her husband and son. She feels good today and is not having symptoms. No bleeding.She is taking 1 iron pill a day. She is tolerating well, but sometime experiences constipation. She is taking Miralax for that. She follows up with her PCP once a year.   MEDICAL HISTORY:  Past Medical History:  Diagnosis Date  . AMA (advanced maternal age) primigravida 35+   . Anemia   . Asthma   . Hashimoto's disease   . Headache   . Herpes   . History of endometriosis   . Hypothyroidism   . Newborn product of in vitro fertilization (IVF) pregnancy   . PONV (postoperative nausea and vomiting)     SURGICAL HISTORY: Past Surgical History:  Procedure Laterality Date  . ABDOMINAL HYSTERECTOMY    . CESAREAN SECTION N/A 01/24/2018   Procedure: CESAREAN SECTION;  Surgeon: Carrington ClampHorvath, Michelle, MD;  [facility];  Service: Obstetrics;  Laterality: N/A;  . CHROMOPERTUBATION N/A 04/20/2015   Procedure: CHROMOPERTUBATION;  Surgeon: Osborn CohoAngela Roberts, MD;  Location: [facility];  Service: Gynecology;  Laterality: N/A;  . COLONOSCOPY  2012  . DIAGNOSTIC LAPAROSCOPY     2012, and 2009  . DILATATION & CURRETTAGE/HYSTEROSCOPY WITH RESECTOCOPE  04/20/2015   Procedure: DILATATION & CURETTAGE/HYSTEROSCOPY WITH RESECTOCOPE;  Surgeon: Osborn CohoAngela Roberts, MD;  Location: [facility];  Service: Gynecology;;  . ENDOMETRIAL ABLATION  2009   Dr Seymour BarsLavoie: Da Vinci-assisted lysis of adhesions for conservative  . FINGER FRACTURE SURGERY  2004  . HERNIA REPAIR    . LAPAROSCOPY N/A 04/20/2015  Procedure: LAPAROSCOPY OPERATIVE;  Surgeon: Osborn Coho, MD;  Location: [facility];  Service: Gynecology;  Laterality: N/A;  . WISDOM TOOTH EXTRACTION Bilateral     SOCIAL HISTORY: Social History   Socioeconomic History  . Marital status: Married    Spouse name: Not on file  . Number of children: Not on file  . Years of education: Not on file  . Highest education level: Not on file  Occupational History  . Not on file  Social  Needs  . Financial resource strain: Not on file  . Food insecurity:    Worry: Not on file    Inability: Not on file  . Transportation needs:    Medical: Not on file    Non-medical: Not on file  Tobacco Use  . Smoking status: Never Smoker  . Smokeless tobacco: Never Used  Substance and Sexual Activity  . Alcohol use: Yes    Comment: social   . Drug use: No  . Sexual activity: Not Currently  Lifestyle  . Physical activity:    Days per week: Not on file    Minutes per session: Not on file  . Stress: Not on file  Relationships  . Social connections:    Talks on phone: Not on file    Gets together: Not on file    Attends religious service: Not on file    Active member of club or organization: Not on file    Attends meetings of clubs or organizations: Not on file    Relationship status: Not on file  . Intimate partner violence:    Fear of current or ex partner: Not on file    Emotionally abused: Not on file    Physically abused: Not on file    Forced sexual activity: Not on file  Other Topics Concern  . Not on file  Social History Narrative  . Not on file    FAMILY HISTORY: Family History  Problem Relation Age of Onset  . Birth defects Cousin        heart born on opposite side  . Hypertension Maternal Grandfather     ALLERGIES:  is allergic to metronidazole and sulfonamide derivatives.  MEDICATIONS:  Current Outpatient Medications  Medication Sig Dispense Refill  . albuterol (PROVENTIL HFA;VENTOLIN HFA) 108 (90 BASE) MCG/ACT inhaler Inhale 1-2 puffs into the lungs every 6 (six) hours as needed for wheezing or shortness of breath.     Marland Kitchen ibuprofen (ADVIL,MOTRIN) 800 MG tablet Take 800 mg by mouth 3 (three) times daily.  4  . levothyroxine (SYNTHROID) 25 MCG tablet Synthroid 25 mcg tablet    . polyethylene glycol (MIRALAX / GLYCOLAX) packet Take 17 g by mouth daily.    . Prenatal Vit-Fe Fumarate-FA (PRENATAL MULTIVITAMIN) TABS tablet Take 1 tablet by mouth daily at 12  noon.     No current facility-administered medications for this visit.     REVIEW OF SYSTEMS:  Constitutional: Denies fevers, chills or abnormal night sweats Eyes: Denies blurriness of vision, double vision or watery eyes Ears, nose, mouth, throat, and face: Denies mucositis or sore throat Respiratory: Denies cough, dyspnea or wheezes Cardiovascular: Denies palpitation, chest discomfort or lower extremity swelling Gastrointestinal:  Denies nausea, heartburn or change in bowel habits (+) constipation sometimes Skin: Denies abnormal skin rashes Lymphatics: Denies new lymphadenopathy or easy bruising Neurological:Denies numbness, tingling or new weaknesses Behavioral/Psych: Mood is stable, no new changes  All other systems were reviewed with the patient and are negative.  PHYSICAL EXAMINATION:  Vitals:  07/13/18 1337  BP: 107/71  Pulse: 66  Resp: 18  Temp: 99.1 F (37.3 C)  SpO2: 99%   Filed Weights   07/13/18 1337  Weight: 117 lb 11.2 oz (53.4 kg)    GENERAL:alert, no distress and comfortable SKIN: skin color, texture, turgor are normal, no rashes or significant lesions EYES: normal, conjunctiva are pink and non-injected, sclera clear OROPHARYNX:no exudate, no erythema and lips, buccal mucosa, and tongue normal  NECK: supple, thyroid normal size, non-tender, without nodularity LYMPH:  no palpable lymphadenopathy in the cervical, axillary or inguinal LUNGS: clear to auscultation and percussion with normal breathing effort HEART: regular rate & rhythm and no murmurs and no lower extremity edema ABDOMEN:abdomen soft, non-tender and normal bowel sounds (+) Surgical incision in the low suprapubic area id healing well. No erythema or discharge. No splenomegaly. She has mild tenderness on the right side of abdomen that is likely related to surgery.  Musculoskeletal:no cyanosis of digits and no clubbing  PSYCH: alert & oriented x 3 with fluent speech NEURO: no focal motor/sensory  deficits  LABORATORY DATA:  I have reviewed the data as listed CBC Latest Ref Rng & Units 07/08/2018 03/11/2018 01/28/2018  WBC 3.9 - 10.3 K/uL 3.3(L) 10.0 14.0(H)  Hemoglobin 11.6 - 15.9 g/dL 16.1 0.9(U) 0.4(V)  Hematocrit 34.8 - 46.6 % 38.7 29.8(L) 26.1(L)  Platelets 145 - 400 K/uL 244 440(H) 207    CMP Latest Ref Rng & Units 01/27/2018 01/27/2018 01/26/2018  Glucose 65 - 99 mg/dL 78 84 82  BUN 6 - 20 mg/dL 5(L) <4(U) <9(W)  Creatinine 0.44 - 1.00 mg/dL 1.19 1.47 8.29  Sodium 135 - 145 mmol/L 134(L) 137 137  Potassium 3.5 - 5.1 mmol/L 3.6 3.5 3.8  Chloride 101 - 111 mmol/L 101 104 108  CO2 22 - 32 mmol/L 24 24 24   Calcium 8.9 - 10.3 mg/dL 7.9(L) 8.2(L) 7.7(L)  Total Protein 6.5 - 8.1 g/dL 5.1(L) - -  Total Bilirubin 0.3 - 1.2 mg/dL 1.0 - -  Alkaline Phos 38 - 126 U/L 59 - -  AST 15 - 41 U/L 24 - -  ALT 14 - 54 U/L 17 - -   Iron/TIBC/Ferritin/ %Sat    Component Value Date/Time   IRON 70 07/08/2018 1032   TIBC 251 07/08/2018 1032   FERRITIN 170 07/08/2018 1032   IRONPCTSAT 28 07/08/2018 1032     RADIOGRAPHIC STUDIES: I have personally reviewed the radiological images as listed and agreed with the findings in the report. No results found.  ASSESSMENT & PLAN:  Heather Pena 37 y.o. female who was referred to me because of excessive bleeding, thrombocytopenia, and iron deficiency anemia during/after Cesarean Section. She does have a PMHx of intermittent mild anemia that was worse during pregnancy.   1. Anemia, secondary to bleeding and iron deficiency  - She is s/p Cesarean Section on 01/23/18 with supracervical hysterectomy and a left salpingectomy due to a hemorrhage and intraoperative bleeding from Endometriosis. She was given 9 units of blood and platelets. -She had anemic of iron deficency during her pregnancy and she received 2 doses of IV iron in Jan 2019 under her PCP  -She has been on oral iron supplement, tolerating well, repeat her lab last week showed resolved  anemia, iron level has been normalized. -Continue oral iron for now  -Since she has had a hysterectomy, I do not anticipate she will have iron deficient anemia again.  - Labs on 4-5 months, follow-up in 1 year if needed.  2. Thrombocytopenia, secondary massive blood loss and blood transfusion  -Her platelets was normal before delivery, and went down to 37K after C-section on 01/24/18 and recovered to normal limits on 01/28/18. -Her transit thrombocytopenia is likely secondary to her massive blood loss and blood transfusion  -Resolved now  3. Constipation - She is aware that her oral iron pill can cause Constipation. She states she has a bowel movement every couple of days.She is using Miralax for this and I advised her that she can increase this to multiple times per day.   4. Massive bleeding  -Her message of bleeding during cesarean section is likely due to endometriosis and poorly contracted uterus, which required a hysterectomy -She never had history of bleeding from previous procedure or surgery, no clinical suspicion for bleeding disorder. -She had a normal PT and APTT, no concern for other bleeding disorder, no further work-up needed for now.  PLAN: -I discussed her lab results from last week, anemia resolved, I will avoid adequate -She will continue oral iron for now, okay to stop after next lab in 4 to 5 months if her iron level remains to be normal -Lab and follow-up in 1 year or as needed.  No problem-specific Assessment & Plan notes found for this encounter.  All questions were answered. The patient knows to call the clinic with any problems, questions or concerns. I spent 10 minutes counseling the patient face to face. The total time spent in the appointment was 15 minutes and more than 50% was on counseling.  Elenor Legato Dweik am acting as scribe for Dr. Malachy Mood.  I have reviewed the above documentation for accuracy and completeness, and I agree with the above.    Malachy Mood,  MD 07/13/18

## 2018-07-10 ENCOUNTER — Inpatient Hospital Stay: Payer: BLUE CROSS/BLUE SHIELD | Admitting: Hematology

## 2018-07-12 ENCOUNTER — Other Ambulatory Visit: Payer: Self-pay | Admitting: Hematology

## 2018-07-12 DIAGNOSIS — D500 Iron deficiency anemia secondary to blood loss (chronic): Secondary | ICD-10-CM

## 2018-07-13 ENCOUNTER — Inpatient Hospital Stay: Payer: BLUE CROSS/BLUE SHIELD | Attending: Hematology | Admitting: Hematology

## 2018-07-13 ENCOUNTER — Telehealth: Payer: Self-pay | Admitting: Hematology

## 2018-07-13 ENCOUNTER — Encounter: Payer: Self-pay | Admitting: Hematology

## 2018-07-13 VITALS — BP 107/71 | HR 66 | Temp 99.1°F | Resp 18 | Ht 60.0 in | Wt 117.7 lb

## 2018-07-13 DIAGNOSIS — D696 Thrombocytopenia, unspecified: Secondary | ICD-10-CM | POA: Insufficient documentation

## 2018-07-13 DIAGNOSIS — D509 Iron deficiency anemia, unspecified: Secondary | ICD-10-CM | POA: Diagnosis present

## 2018-07-13 DIAGNOSIS — K5900 Constipation, unspecified: Secondary | ICD-10-CM | POA: Diagnosis not present

## 2018-07-13 DIAGNOSIS — D500 Iron deficiency anemia secondary to blood loss (chronic): Secondary | ICD-10-CM

## 2018-07-13 NOTE — Telephone Encounter (Signed)
Gave patient avs report and appointments for December 2019 and August 2020.

## 2018-08-25 ENCOUNTER — Emergency Department (HOSPITAL_COMMUNITY)
Admission: EM | Admit: 2018-08-25 | Discharge: 2018-08-26 | Disposition: A | Discharge status: Home / Self Care | Payer: BLUE CROSS/BLUE SHIELD | Attending: Emergency Medicine | Admitting: Emergency Medicine

## 2018-08-25 ENCOUNTER — Encounter (HOSPITAL_COMMUNITY): Payer: Self-pay | Admitting: Emergency Medicine

## 2018-08-25 ENCOUNTER — Emergency Department (HOSPITAL_COMMUNITY): Payer: BLUE CROSS/BLUE SHIELD

## 2018-08-25 ENCOUNTER — Other Ambulatory Visit: Payer: Self-pay

## 2018-08-25 DIAGNOSIS — J45909 Unspecified asthma, uncomplicated: Secondary | ICD-10-CM | POA: Diagnosis not present

## 2018-08-25 DIAGNOSIS — R220 Localized swelling, mass and lump, head: Secondary | ICD-10-CM | POA: Insufficient documentation

## 2018-08-25 DIAGNOSIS — E039 Hypothyroidism, unspecified: Secondary | ICD-10-CM | POA: Diagnosis not present

## 2018-08-25 DIAGNOSIS — K1121 Acute sialoadenitis: Secondary | ICD-10-CM | POA: Diagnosis not present

## 2018-08-25 LAB — CBC
HCT: 34.5 % — ABNORMAL LOW (ref 36.0–46.0)
HEMOGLOBIN: 11.1 g/dL — AB (ref 12.0–15.0)
MCH: 28 pg (ref 26.0–34.0)
MCHC: 32.2 g/dL (ref 30.0–36.0)
MCV: 86.9 fL (ref 78.0–100.0)
Platelets: 266 10*3/uL (ref 150–400)
RBC: 3.97 MIL/uL (ref 3.87–5.11)
RDW: 13.2 % (ref 11.5–15.5)
WBC: 4.9 10*3/uL (ref 4.0–10.5)

## 2018-08-25 LAB — BASIC METABOLIC PANEL
Anion gap: 7 (ref 5–15)
BUN: 14 mg/dL (ref 6–20)
CALCIUM: 8.9 mg/dL (ref 8.9–10.3)
CHLORIDE: 112 mmol/L — AB (ref 98–111)
CO2: 26 mmol/L (ref 22–32)
CREATININE: 0.71 mg/dL (ref 0.44–1.00)
Glucose, Bld: 86 mg/dL (ref 70–99)
Potassium: 3.7 mmol/L (ref 3.5–5.1)
SODIUM: 145 mmol/L (ref 135–145)

## 2018-08-25 MED ORDER — IOHEXOL 300 MG/ML  SOLN
75.0000 mL | Freq: Once | INTRAMUSCULAR | Status: AC | PRN
  Administered 2018-08-25: 75 mL via INTRAVENOUS

## 2018-08-25 NOTE — ED Provider Notes (Addendum)
[facility] Emergency Department Provider Note MRN:  [id number]  Arrival date & time: 08/26/18     Chief Complaint   Facial Swelling   History of Present Illness   Heather Pena is a 37 y.o. year-old female with a history of hypothyroidism presenting to the ED with chief complaint of facial swelling.  Patient has had intermittent swelling of the left side of the face for several months.  Felt her normal self yesterday with no swelling.  Today, she experienced rapidly progressing swelling to the left side of the face, in the area she attributes to her salivary gland.  Significant swelling today but is very noticeable, which has never occurred before.  Normally is mild.  Having associated pain in the area that she describes this tasting something extremely sour.  This pain only occurs when she tries to eat something.  Denies fever, no headache or vision change, no chest pain or shortness of breath, no nausea vomiting or diarrhea, no unintentional weight loss.  Review of Systems  A complete 10 system review of systems was obtained and all systems are negative except as noted in the HPI and PMH.   Patient's Health History    Past Medical History:  Diagnosis Date  . AMA (advanced maternal age) primigravida 35+   . Anemia   . Asthma   . Hashimoto's disease   . Headache   . Herpes   . History of endometriosis   . Hypothyroidism   . Newborn product of in vitro fertilization (IVF) pregnancy   . PONV (postoperative nausea and vomiting)     Past Surgical History:  Procedure Laterality Date  . ABDOMINAL HYSTERECTOMY    . CESAREAN SECTION N/A 01/24/2018   Procedure: CESAREAN SECTION;  Surgeon: Carrington Clamp, MD;  Location: [facility]TH[facility] SU[facility];  Service: Obstetrics;  Laterality: N/A;  . CHROMOPERTUBATION N/A 04/20/2015   Procedure: CHROMOPERTUBATION;  Surgeon: Osborn Coho, MD;  Location: [facility];  Service: Gynecology;  Laterality: N/A;  . COLONOSCOPY  2012  .  DIAGNOSTIC LAPAROSCOPY     2012, and 2009  . DILATATION & CURRETTAGE/HYSTEROSCOPY WITH RESECTOCOPE  04/20/2015   Procedure: DILATATION & CURETTAGE/HYSTEROSCOPY WITH RESECTOCOPE;  Surgeon: Osborn Coho, MD;  Location: [facility];  Service: Gynecology;;  . ENDOMETRIAL ABLATION  2009   Dr Seymour Bars: Da Vinci-assisted lysis of adhesions for conservative  . FINGER FRACTURE SURGERY  2004  . HERNIA REPAIR    . LAPAROSCOPY N/A 04/20/2015   Procedure: LAPAROSCOPY OPERATIVE;  Surgeon: Osborn Coho, MD;  Location: [facility];  Service: Gynecology;  Laterality: N/A;  . WISDOM TOOTH EXTRACTION Bilateral     Family History  Problem Relation Age of Onset  . Birth defects Cousin        heart born on opposite side  . Hypertension Maternal Grandfather     Social History   Socioeconomic History  . Marital status: Married    Spouse name: Not on file  . Number of children: Not on file  . Years of education: Not on file  . Highest education level: Not on file  Occupational History  . Not on file  Social Needs  . Financial resource strain: Not on file  . Food insecurity:    Worry: Not on file    Inability: Not on file  . Transportation needs:    Medical: Not on file    Non-medical: Not on file  Tobacco Use  . Smoking status: Never Smoker  . Smokeless tobacco: Never Used  Substance and Sexual Activity  . Alcohol use: Yes    Comment: social   . Drug use: No  . Sexual activity: Not Currently  Lifestyle  . Physical activity:    Days per week: Not on file    Minutes per session: Not on file  . Stress: Not on file  Relationships  . Social connections:    Talks on phone: Not on file    Gets together: Not on file    Attends religious service: Not on file    Active member of club or organization: Not on file    Attends meetings of clubs or organizations: Not on file    Relationship status: Not on file  . Intimate partner violence:    Fear of current or ex partner: Not on file    Emotionally abused:  Not on file    Physically abused: Not on file    Forced sexual activity: Not on file  Other Topics Concern  . Not on file  Social History Narrative  . Not on file     Physical Exam  Vital Signs and Nursing Notes reviewed Vitals:   08/25/18 2033 08/25/18 2207  BP: 127/80 (!) 125/91  Pulse: 67 81  Resp: 14 14  Temp: 98.3 F (36.8 C)   SpO2: 99% 100%    CONSTITUTIONAL: Well-appearing, NAD NEURO:  Alert and oriented x 3, no focal deficits EYES:  eyes equal and reactive ENT/NECK:  no LAD, no JVD, edema of the left buccal soft tissue surrounding the parotid gland, which is tender to palpation, no abnormalities of the oropharynx CARDIO: Regular rate, well-perfused, normal S1 and S2 PULM:  CTAB no wheezing or rhonchi GI/GU:  normal bowel sounds, non-distended, non-tender MSK/SPINE:  No gross deformities, no edema SKIN:  no rash, atraumatic PSYCH:  Appropriate speech and behavior  Diagnostic and Interventional Summary    EKG Interpretation  Date/Time:    Ventricular Rate:    PR Interval:    QRS Duration:   QT Interval:    QTC Calculation:   R Axis:     Text Interpretation:        Labs Reviewed  CBC - Abnormal; Notable for the following components:      Result Value   Hemoglobin 11.1 (*)    HCT 34.5 (*)    All other components within normal limits  BASIC METABOLIC PANEL - Abnormal; Notable for the following components:   Chloride 112 (*)    All other components within normal limits    CT Maxillofacial W Contrast  Final Result      Medications  iohexol (OMNIPAQUE) 300 MG/ML solution 75 mL (75 mLs Intravenous Contrast Given 08/25/18 2326)     Procedures Critical Care  ED Course and Medical Decision Making  I have reviewed the triage vital signs and the nursing notes.  Pertinent labs & imaging results that were available during my care of the patient were reviewed by me and considered in my medical decision making (see below for details).    Considering  parotitis versus soft tissue mass versus abscess in this 37 year old female with asymmetric facial swelling.  No airway concerns, vital signs stable.  Labs, CT, reassess.  CT confirms parotitis with no other complicating features.  Prescription for Augmentin, Tylenol at home for pain.  Discussed the breast-feeding implications of Augmentin, very low levels in the breastmilk, possibility of constipation or diarrhea and the infant but very rare.  After the discussed management above, the patient was determined  to be safe for discharge.  The patient was in agreement with this plan and all questions regarding their care were answered.  ED return precautions were discussed and the patient will return to the ED with any significant worsening of condition.  Elmer SowMichael M. Pilar PlateBero, MD [facility] [email or web address]  Final Clinical Impressions(s) / ED Diagnoses     ICD-10-CM   1. Facial swelling R22.0   2. Parotitis, acute K11.21     ED Discharge Orders         Ordered    amoxicillin-clavulanate (AUGMENTIN) 875-125 MG tablet  Every 12 hours     08/26/18 0014             Sabas SousBero, Michael M, MD 08/25/18 2345    Sabas SousBero, Michael M, MD 08/26/18 [id number]

## 2018-08-25 NOTE — ED Triage Notes (Signed)
Patient c/o pain under left side of tongue intermittently for three months. Reports today she noticed swelling to left side of face. Describes pain as "feeling like something sour in mouth." Denies difficulty breathing and throat swelling.

## 2018-08-26 MED ORDER — AMOXICILLIN-POT CLAVULANATE 875-125 MG PO TABS: 1.0000 | ORAL_TABLET | Freq: Two times a day (BID) | ORAL | 0 refills | Status: AC

## 2018-08-26 NOTE — Discharge Instructions (Signed)
You were evaluated in the Emergency Department and after careful evaluation, we did not find any emergent condition requiring admission or further testing in the hospital.  Your symptoms today seem to be due to inflammation and infection of the parotid gland.  Please take the antibiotic as directed and follow-up with your regular doctor.  You can try sour candies at home to help with the symptoms.  Please return to the Emergency Department if you experience any worsening of your condition.  We encourage you to follow up with a primary care provider.  Thank you for allowing us to be a part of your care.

## 2018-12-07 ENCOUNTER — Inpatient Hospital Stay: Payer: BLUE CROSS/BLUE SHIELD | Attending: Hematology

## 2019-02-17 IMAGING — DX DG ABDOMEN 1V
1 series · 1 of 1 positions shown · non-contrast
Comparison: None.

CLINICAL DATA: NG tube placement.

EXAM:
ABDOMEN - 1 VIEW

[abdomen]
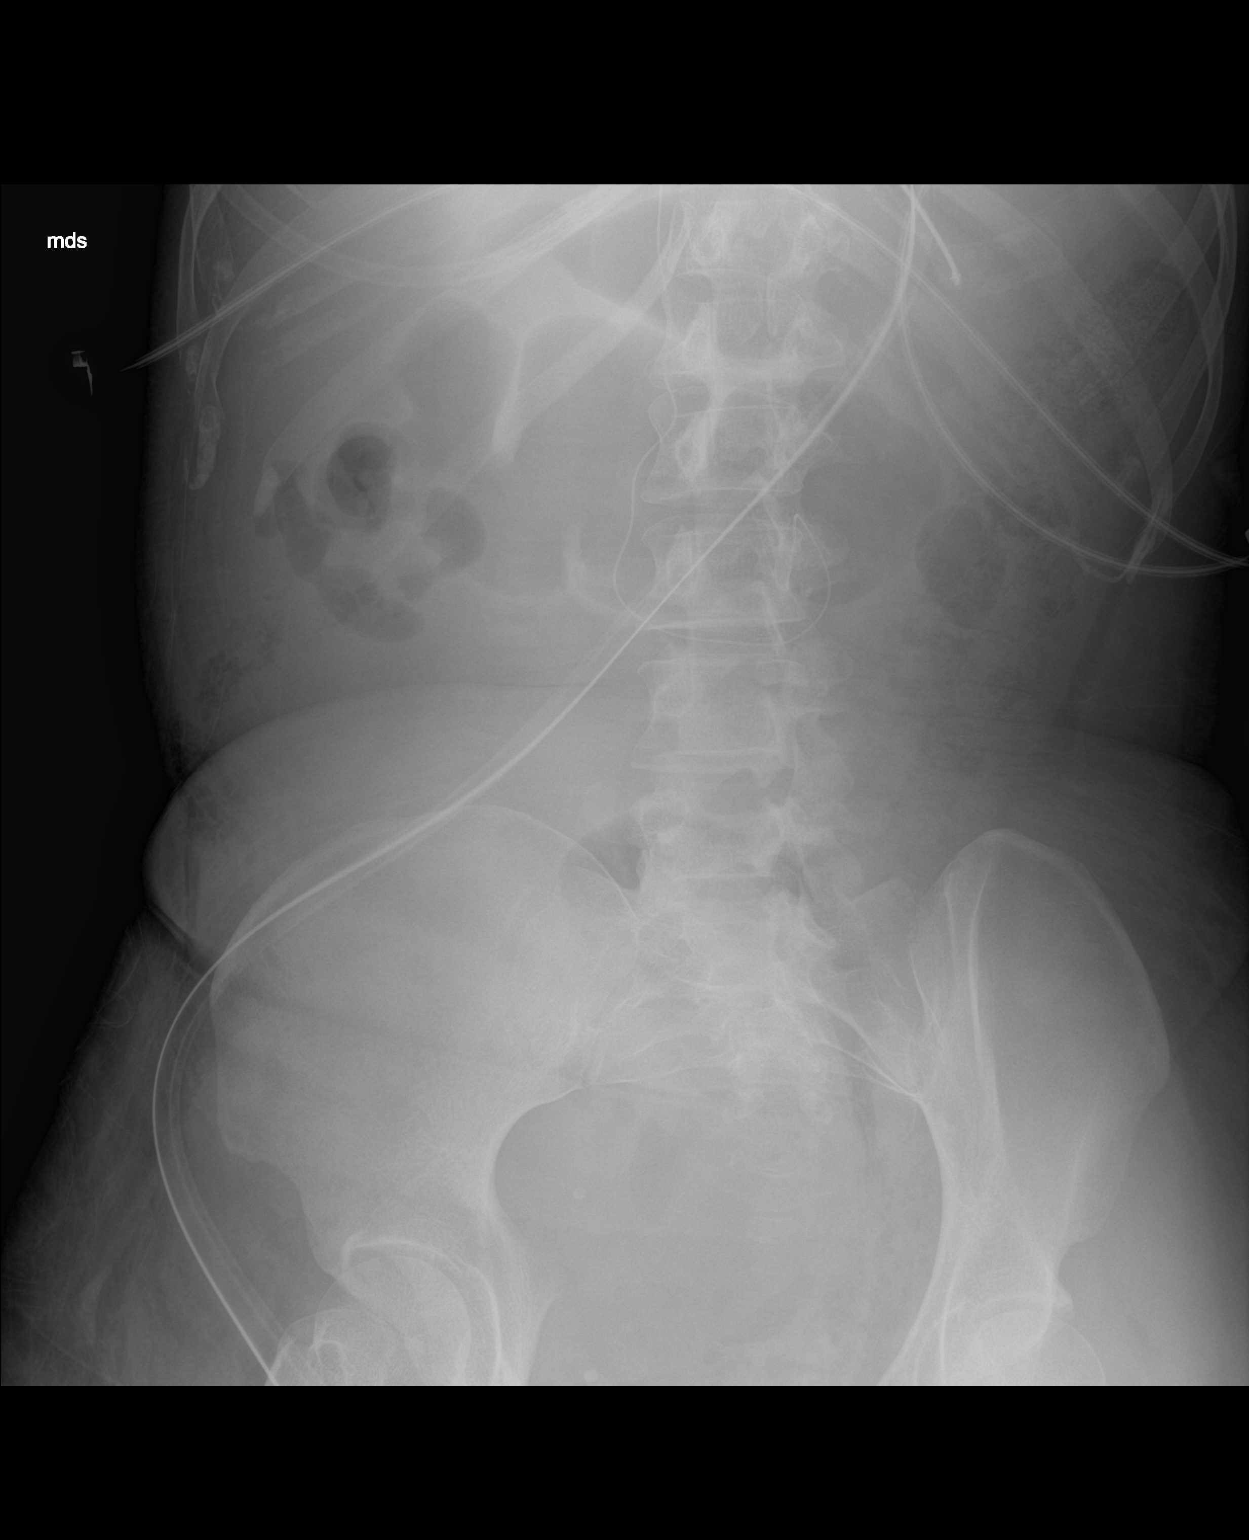

[1 of 1 positions shown; findings below may reference images not displayed]

FINDINGS: The tip of an enteric tube projects over the proximal gastric body
with side hole in the region of the GE junction. Gas and a small
amount of stool are present in the colon. No dilated loops of bowel
are seen to suggest obstruction. No acute osseous abnormality is
identified.
IMPRESSION: Enteric tube terminates in the stomach with side hole near the GE
junction.

## 2019-02-17 IMAGING — DX DG CHEST 1V PORT
1 series · 1 of 1 positions shown · non-contrast
Comparison: None

CLINICAL DATA: Nasogastric tube placement. Recent cesarean section.

EXAM:
PORTABLE CHEST 1 VIEW

[chest]
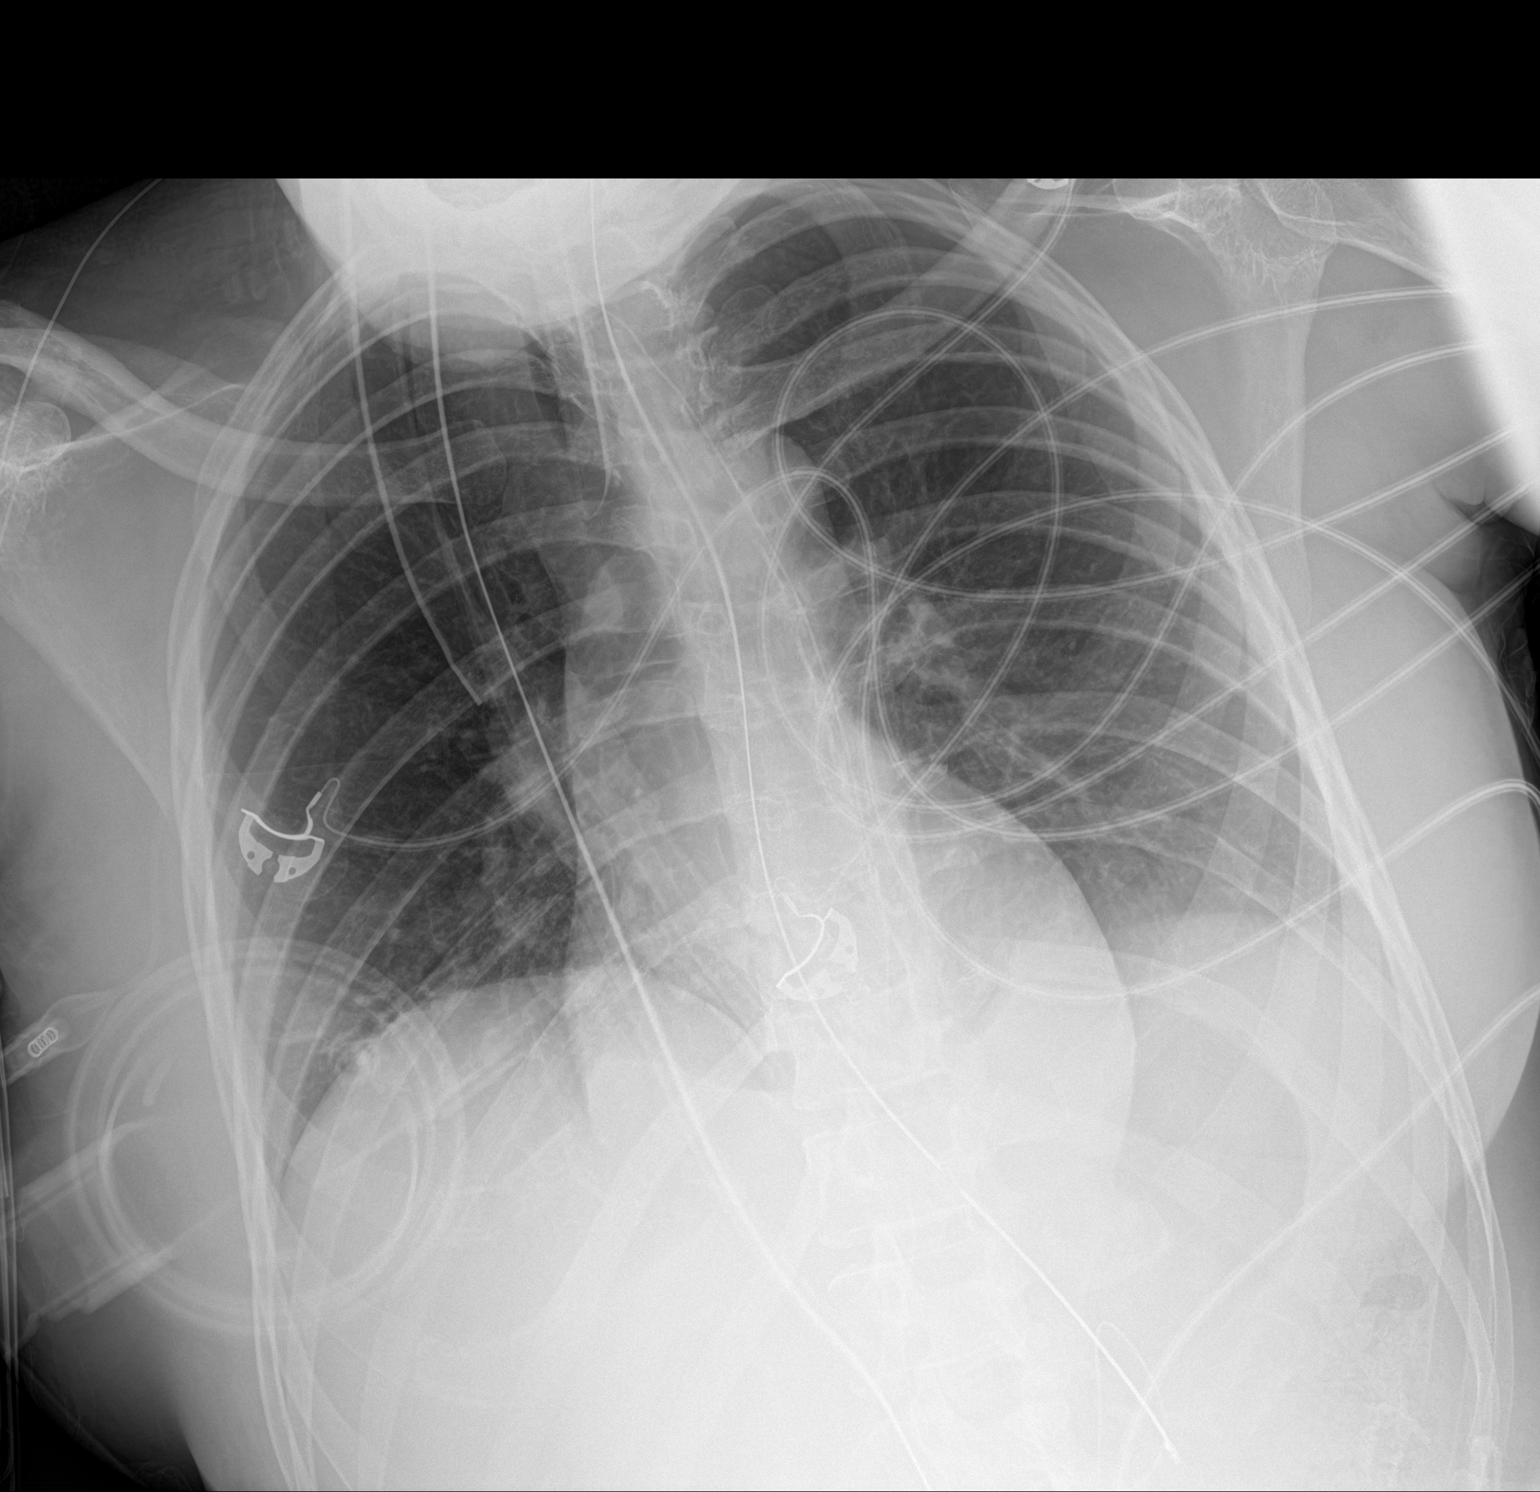

[1 of 1 positions shown; findings below may reference images not displayed]

FINDINGS: Endotracheal tube is 3.9 cm above the carina. Nasogastric tube
extends into the abdomen and the tip appears to be in the stomach
body region. Multiple lines overlying the chest. Heart size is
within normal limits. Haziness at the lung bases could represent
atelectasis. Negative for pneumothorax.
IMPRESSION: Nasogastric tube appears to be in the stomach.

Mild basilar atelectasis.

## 2019-02-18 IMAGING — CT CT ABD-PELV W/ CM
2 of 4 series · 13 of 46 positions shown, 15 images · IV contrast (iopamidol)
Comparison: None.

CLINICAL DATA: Status post cesarean section and emergent
hysterectomy for bleeding yesterday.

EXAM:
CT ABDOMEN AND PELVIS WITH CONTRAST
TECHNIQUE: Multidetector CT imaging of the abdomen and pelvis was performed
using the standard protocol following bolus administration of
intravenous contrast.
CONTRAST:  100mL T8RSS3-X88 IOPAMIDOL (T8RSS3-X88) INJECTION 61%

[Series 3: abdomen 5.0 · axial · 0.74mm/px · z∈[+949,+1369]mm · 10 of 99 slices shown, 12 images]
[im 10/99  soft-tissue]
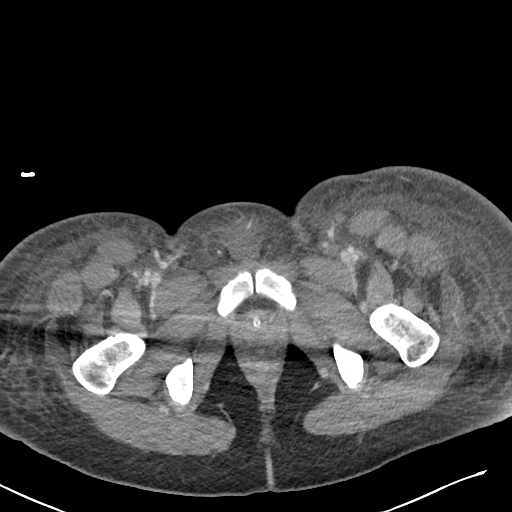
[im 10/99  bone]
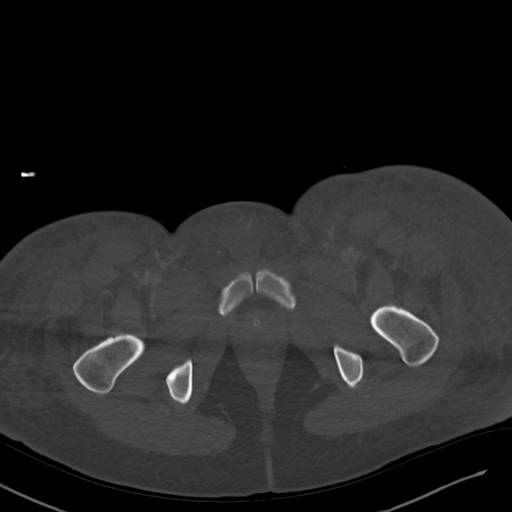
[im 19/99  soft-tissue]
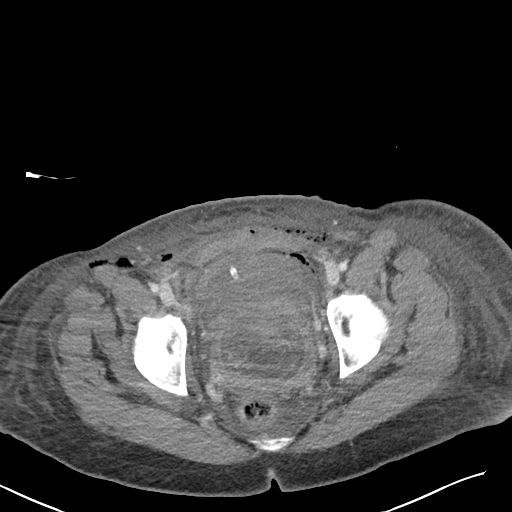
[im 29/99  soft-tissue]
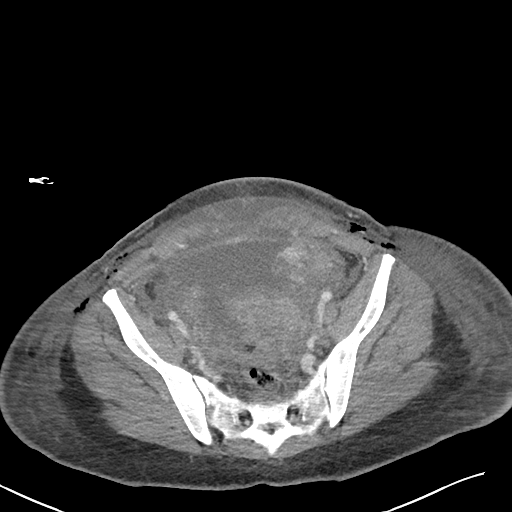
[im 38/99  soft-tissue]
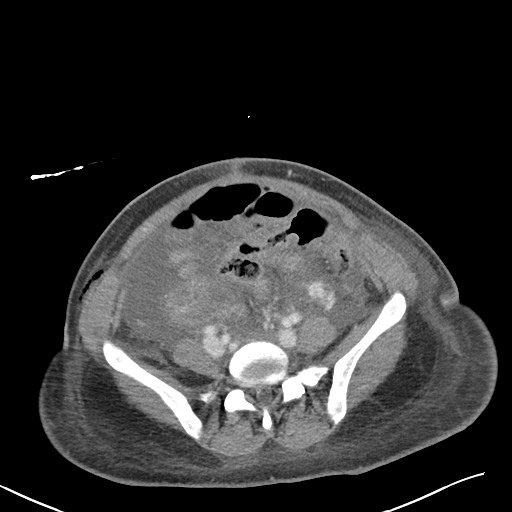
[im 47/99  soft-tissue]
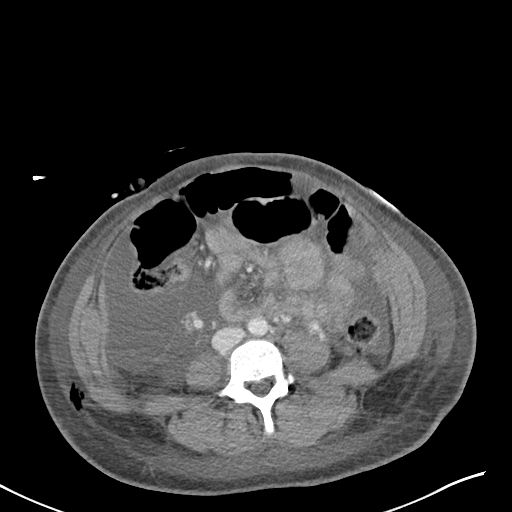
[im 57/99  soft-tissue]
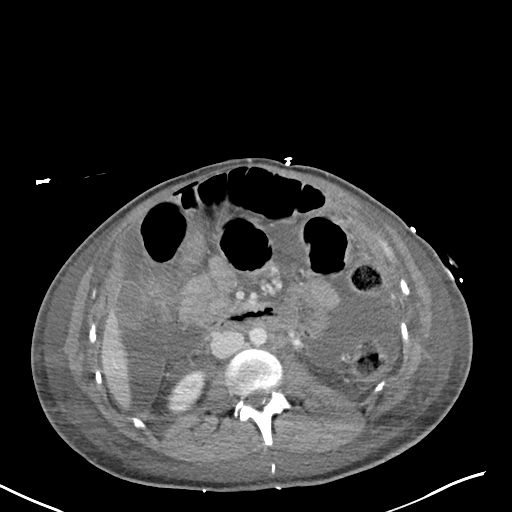
[im 66/99  soft-tissue]
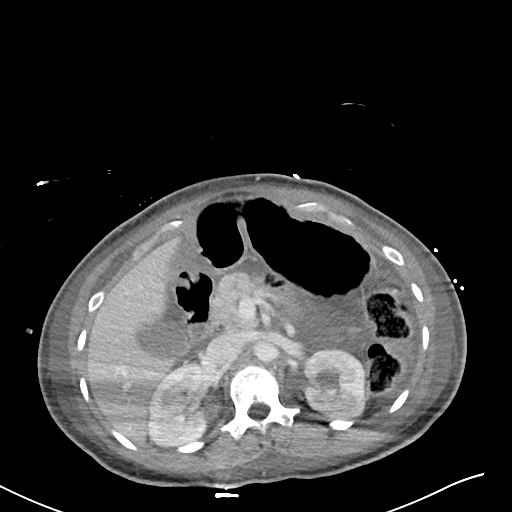
[im 75/99  soft-tissue]
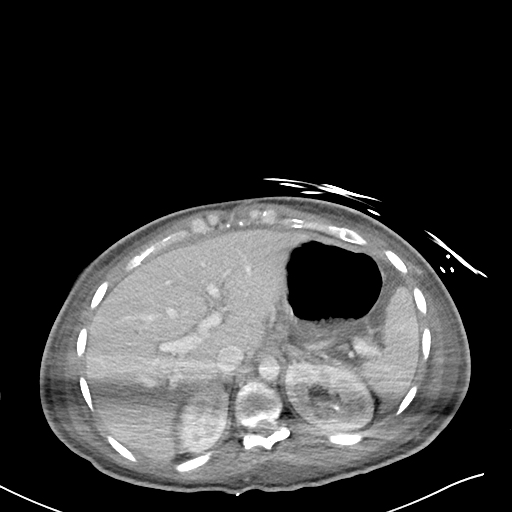
[im 85/99  soft-tissue]
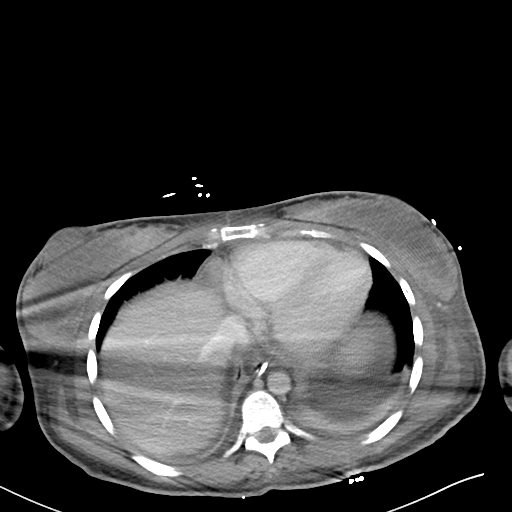
[im 85/99  bone]
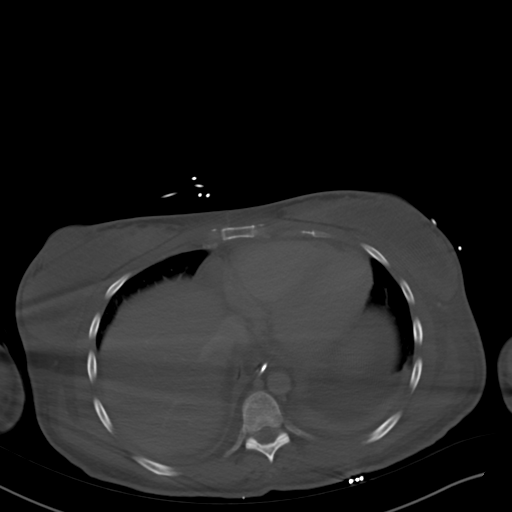
[im 94/99  soft-tissue]
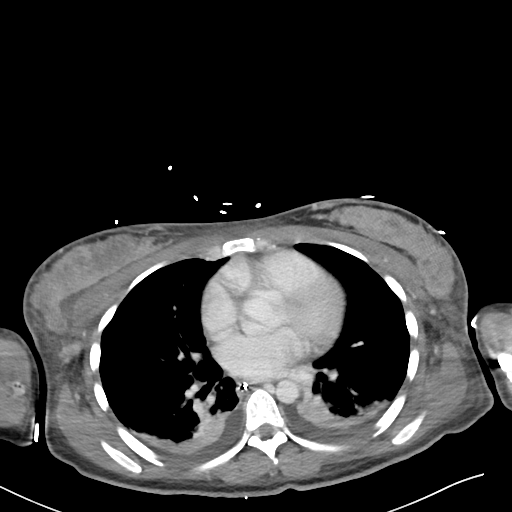

[Series 6: abdomen 3.0 mpr cor · coronal · 0.56mm/px · 3 of 99 slices shown]
[im 33/99  soft-tissue]
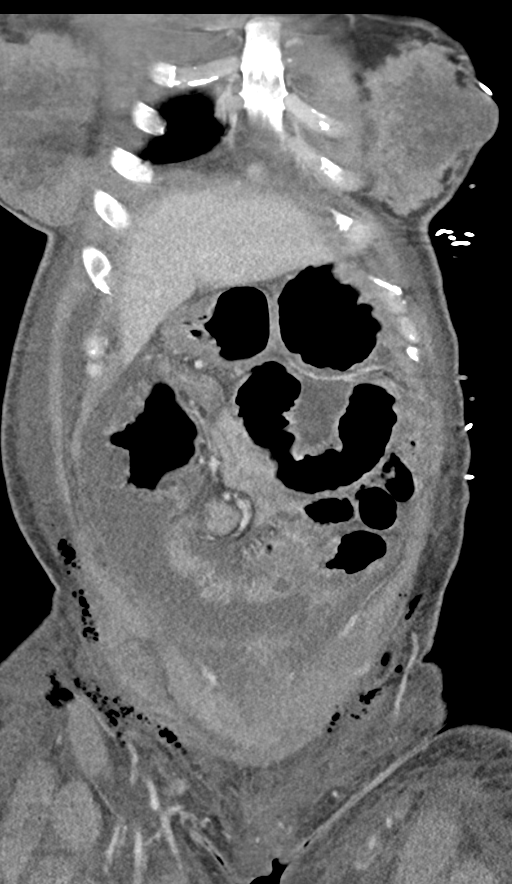
[im 44/99  soft-tissue]
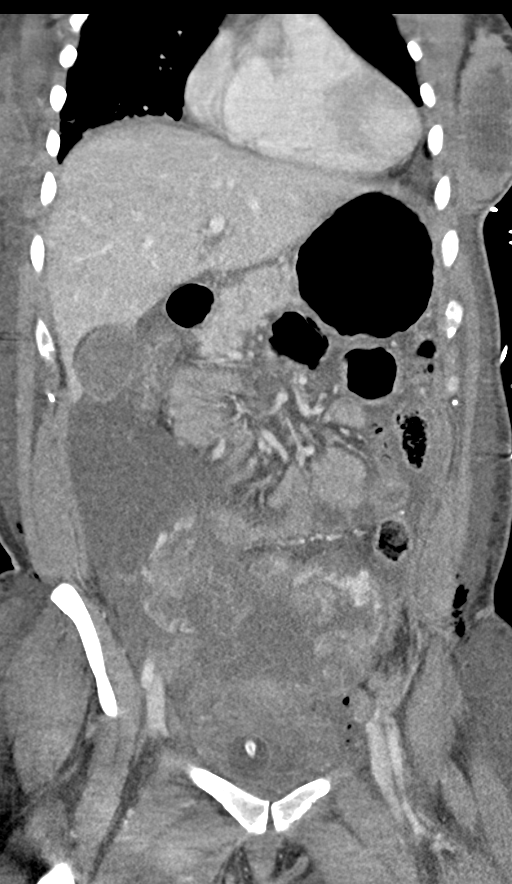
[im 55/99  soft-tissue]
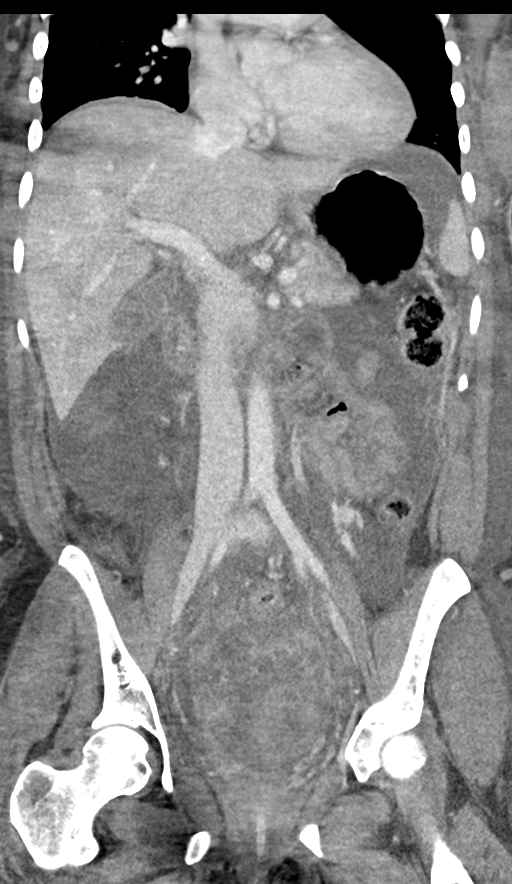

[13 of 46 positions shown; findings below may reference images not displayed]

FINDINGS: Lower chest: Dependent atelectasis noted in the lower lobes with
small bilateral pleural effusions.

Hepatobiliary: Tiny hypodensities in the liver parenchyma are too
small to characterize but likely cysts. There is no evidence for
gallstones, gallbladder wall thickening, or pericholecystic fluid.
No intrahepatic or extrahepatic biliary dilation.

Pancreas: No focal mass lesion. No dilatation of the main duct. No
intraparenchymal cyst. No peripancreatic edema.

Spleen: No splenomegaly. No focal mass lesion.

Adrenals/Urinary Tract: No adrenal nodule or mass. Right kidney
unremarkable. Mild fullness noted left intrarenal collecting system
and proximal left ureter. Bladder is decompressed by Foley catheter.

Stomach/Bowel: NG tube tip is positioned in the distal esophagus.
Mild to moderate distention of stomach. Duodenum is normally
positioned as is the ligament of Treitz. No small bowel or colonic
dilatation.

Vascular/Lymphatic: No abdominal aortic aneurysm. Portal vein and
superior mesenteric vein are patent. There is no gastrohepatic or
hepatoduodenal ligament lymphadenopathy. No intraperitoneal or
retroperitoneal lymphadenopathy. No pelvic sidewall lymphadenopathy.

Reproductive: Uterus is surgically absent. The cervix is prominent
and appears enlarged/edematous. Tissue planes in the pelvic floor
and in the region of the cervix are not well preserved given the
substantial soft tissue edema and intraperitoneal free fluid.
Prominent opacification of the left gonadal vasculature is evident.

Other: Moderate to large volume of free fluid is identified in the
peritoneal cavity of the abdomen and pelvis. This fluid measures
slightly higher in attenuation than would be expected for simple or
serous fluid but no findings of overt hemoperitoneum evident by CT.
Given the history of bleeding during surgery yesterday, there is
likely some hemorrhagic component to the fluid on today's exam
resulting in the minimally higher attenuation than expected for
simple fluid. No gross intraperitoneal blood clots are evident.
There is no finding to suggest active extravasation on the current
study.

Extensive body wall and mesenteric edema is evident. There is gas in
the subcutaneous fat and rectus sheath compatible with the history
of recent surgery. Fluid and gas in the rectus sheath itself is
identified in the region of a low transverse incision.

Musculoskeletal: Bone windows reveal no worrisome lytic or sclerotic
osseous lesions.
IMPRESSION: 1. Moderate to large volume intraperitoneal free fluid. Average
attenuation of this fluid is slightly higher than would be expected
for simple or serous fluid suggesting complication by hemorrhage.
The fluid does not have attenuation as high as typically seen for
acute or frank hemoperitoneum and there is no evidence for clots
within the fluid. No findings of active extravasation on today's CT
scan.
2. Enlarged, edematous cervix in this patient one-day from cesarean
section and emergent hysterectomy. There is edema and obscuration of
fat planes in the pelvic for.
3. Marked body wall edema.
4. Above findings were discussed with Dr. Blondinacka at the time of
initial interpretation. NG tube tip
5. NG tube tip is in the distal esophagus. This finding was called
directly to the patient's nurse, Bardhyl Vjollca, at [DATE] a.m. on
01/25/2018.

## 2019-07-08 ENCOUNTER — Telehealth: Payer: Self-pay | Admitting: Hematology

## 2019-07-08 NOTE — Telephone Encounter (Signed)
R/s appt per 7/30 sch message - pt aware of appt date and time

## 2019-07-12 ENCOUNTER — Ambulatory Visit: Payer: BLUE CROSS/BLUE SHIELD | Admitting: Hematology

## 2019-07-12 ENCOUNTER — Other Ambulatory Visit: Payer: BLUE CROSS/BLUE SHIELD

## 2019-09-18 IMAGING — CT CT MAXILLOFACIAL W/ CM
3 of 4 series · 15 of 47 positions shown, 18 images · IV contrast (omnipaque)
Comparison: None.

CLINICAL DATA: Initial evaluation for acute left-sided facial
swelling.

EXAM:
CT MAXILLOFACIAL WITH CONTRAST
TECHNIQUE: Multidetector CT imaging of the maxillofacial structures was
performed with intravenous contrast. Multiplanar CT image
reconstructions were also generated.
CONTRAST:  75mL OMNIPAQUE IOHEXOL 300 MG/ML  SOLN

[Series 3: max soft · axial · 0.43mm/px · z∈[-139,-9]mm · 11 of 77 slices shown, 14 images]
[im 6/77  brain]
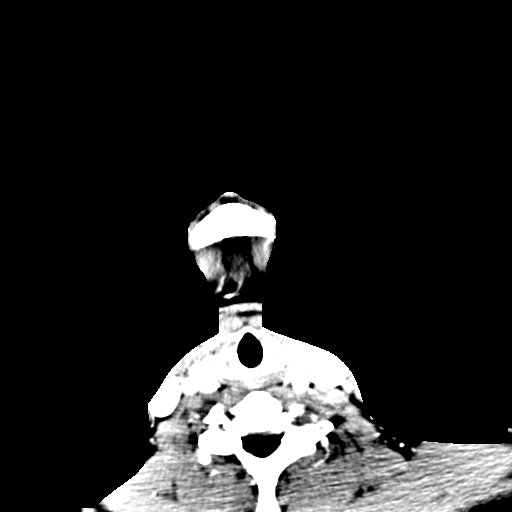
[im 6/77  bone]
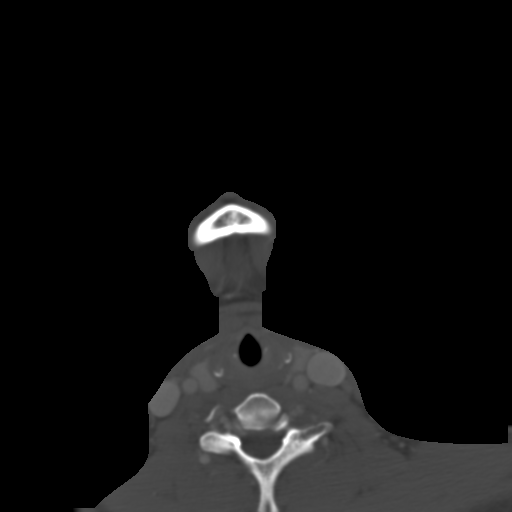
[im 11/77  bone]
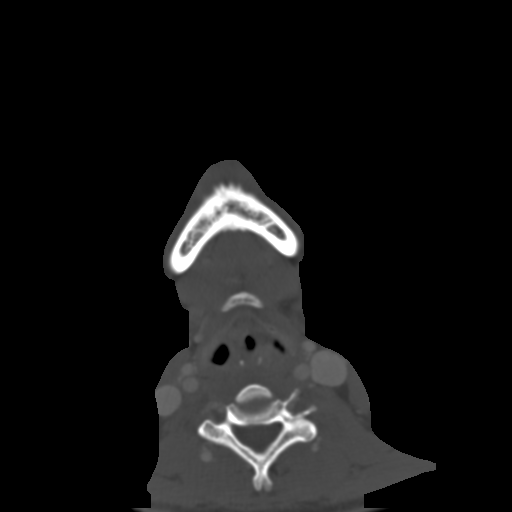
[im 19/77  bone]
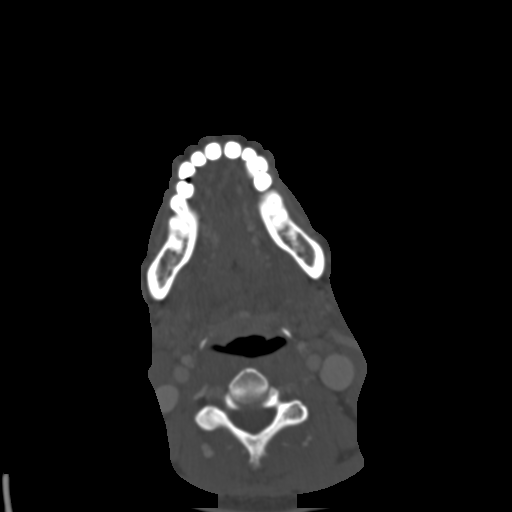
[im 24/77  bone]
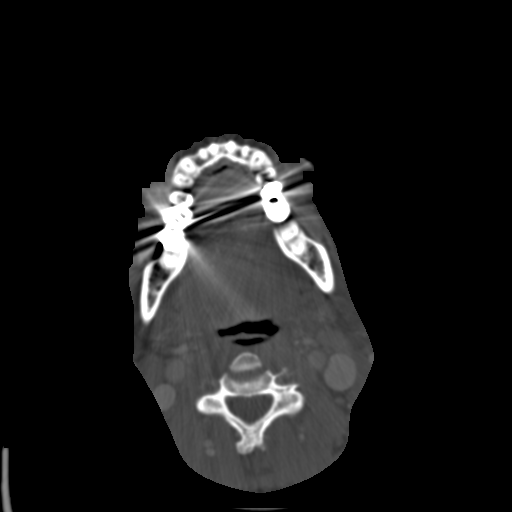
[im 32/77  brain]
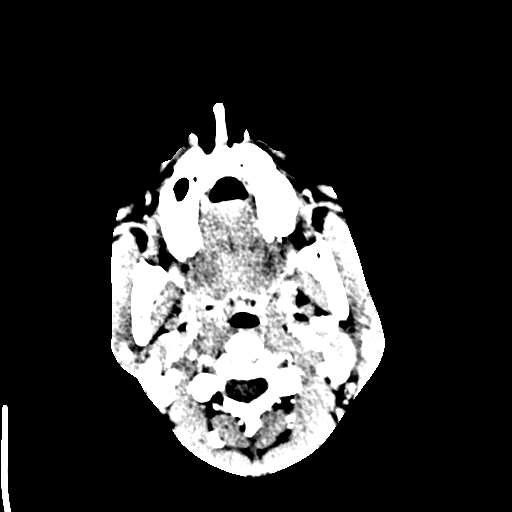
[im 32/77  bone]
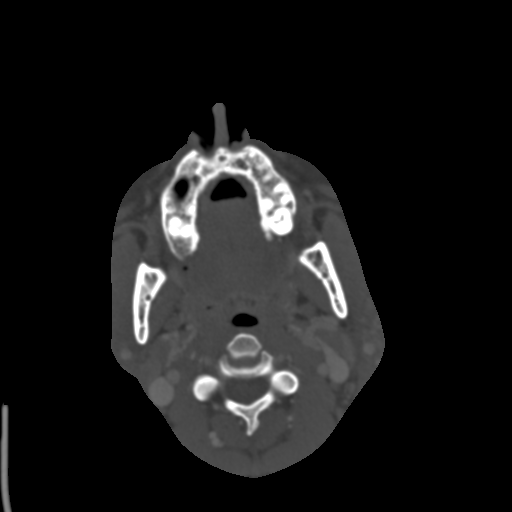
[im 40/77  bone]
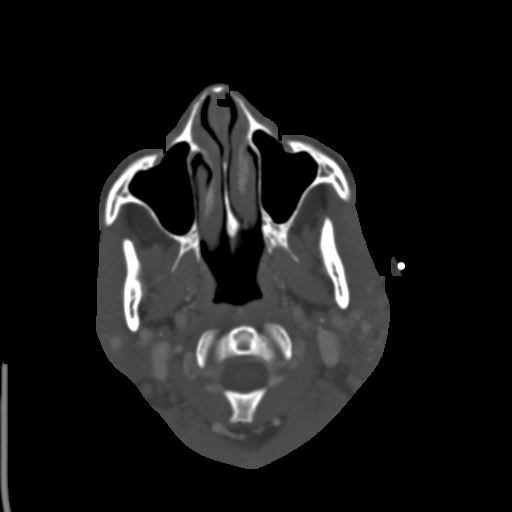
[im 45/77  bone]
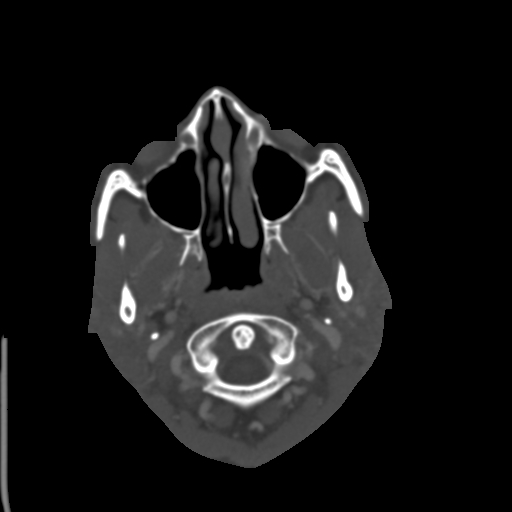
[im 53/77  bone]
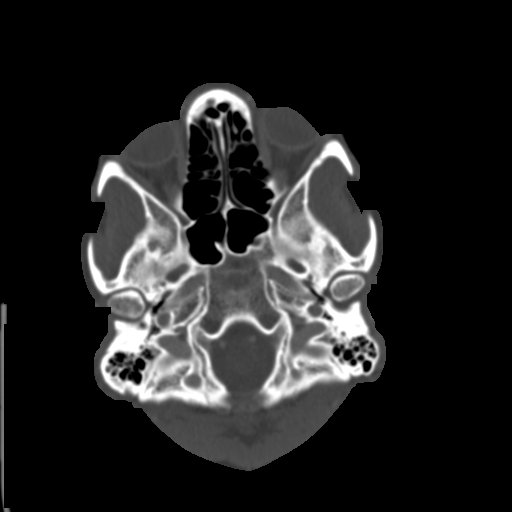
[im 58/77  brain]
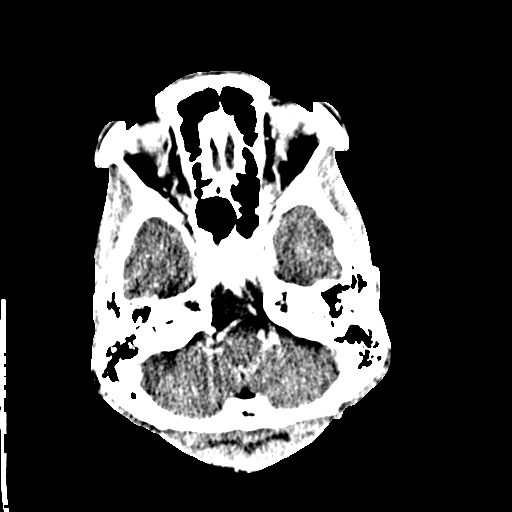
[im 58/77  bone]
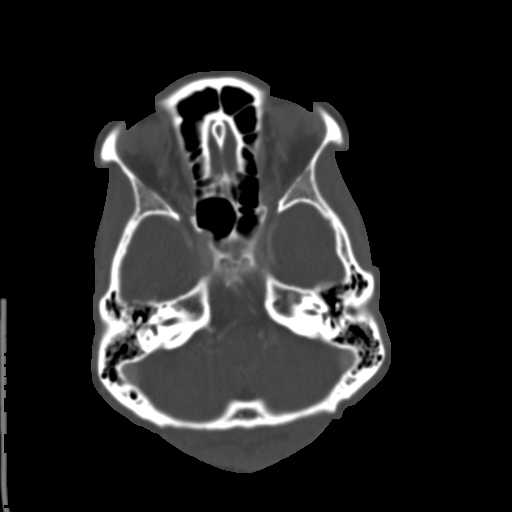
[im 66/77  bone]
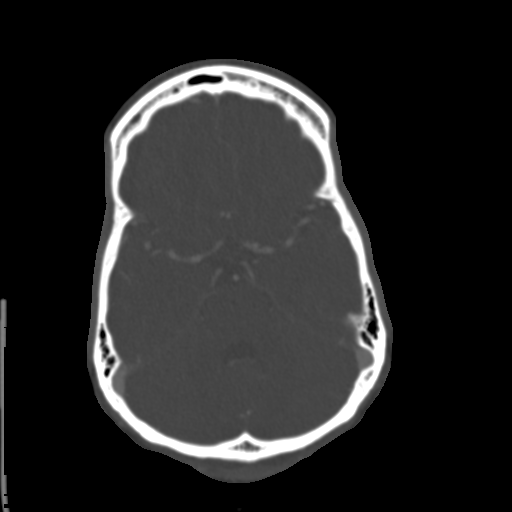
[im 71/77  bone]
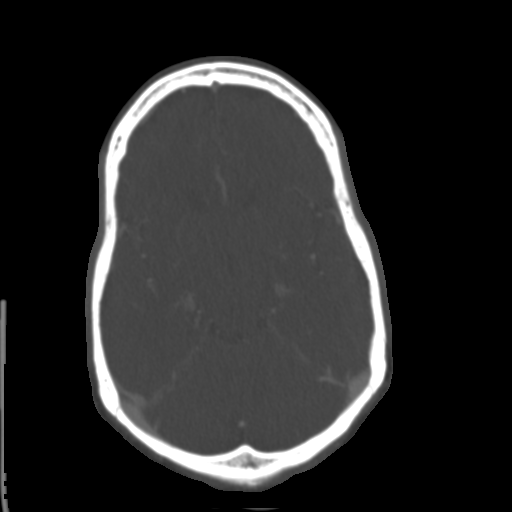

[Series 7: coronal soft · coronal · 0.35mm/px · 3 of 80 slices shown]
[im 27/80  bone]
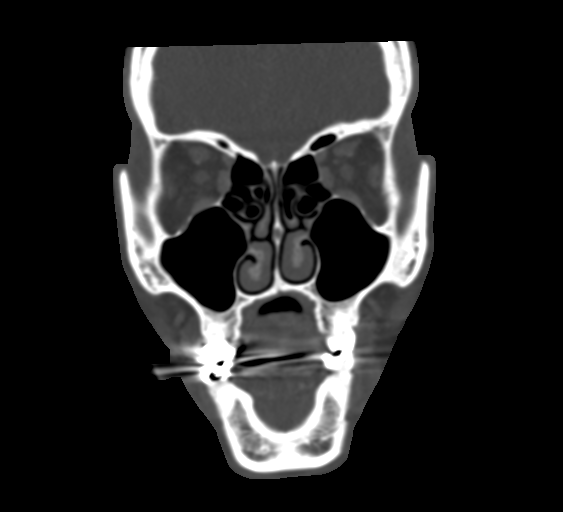
[im 36/80  bone]
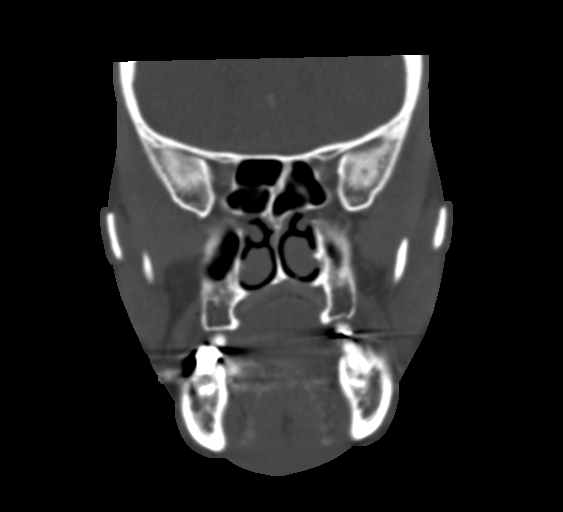
[im 44/80  bone]
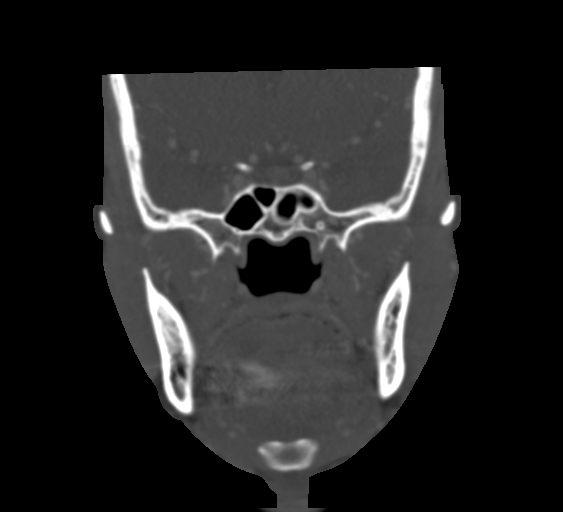

[Series 10: sagittal bone · sagittal · 0.36mm/px · 1 of 65 slices shown]
[im 33/65  bone]
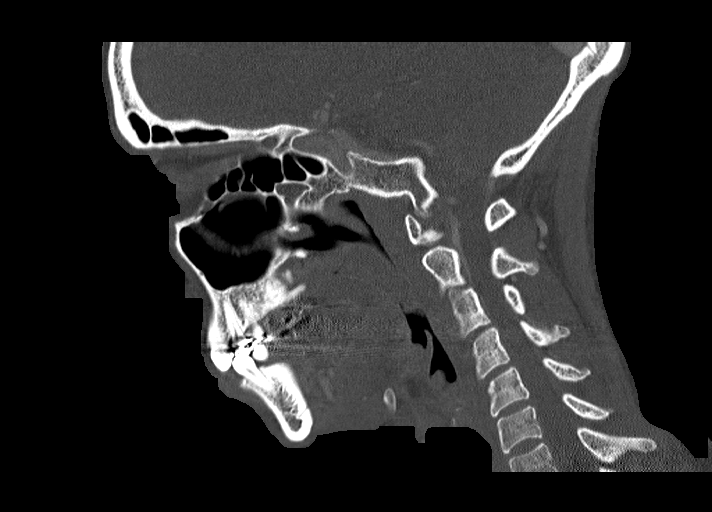

[15 of 47 positions shown; findings below may reference images not displayed]

FINDINGS: Osseous: Visualized osseous structures of the face are normal in
appearance.

Orbits: Globes and orbital soft tissues are within normal limits
without acute abnormality.

Sinuses: Paranasal sinuses are well pneumatized and free of fluid.
Mastoid air cells and middle ear cavities are clear.

Soft tissues: Asymmetric enlargement with hyperdense appearance of
the left parotid gland, suggesting acute parotitis. Mild swelling
with induration and inflammatory stranding within the adjacent
subcutaneous fat of the left parotid space. No obstructive stone
seen within [place]. No discrete abscess or other drainable
fluid collection.

Visualized soft tissues of the face and neck otherwise unremarkable.

Limited intracranial: Unremarkable.
IMPRESSION: Findings consistent with acute left-sided parotitis. No obstructive
stone or drainable fluid collection identified.

## 2019-12-22 ENCOUNTER — Inpatient Hospital Stay: Payer: BC Managed Care – PPO | Admitting: Hematology

## 2019-12-22 ENCOUNTER — Inpatient Hospital Stay: Payer: BC Managed Care – PPO | Attending: Hematology

## 2019-12-23 ENCOUNTER — Telehealth: Payer: Self-pay | Admitting: Hematology

## 2019-12-23 NOTE — Telephone Encounter (Signed)
Scheduled per 1/13 sch msg. Called and spoke with pt, confirmed 2/17 appt

## 2020-01-04 ENCOUNTER — Other Ambulatory Visit: Payer: BC Managed Care – PPO

## 2020-01-04 ENCOUNTER — Ambulatory Visit: Payer: BC Managed Care – PPO | Admitting: Hematology

## 2020-01-26 ENCOUNTER — Telehealth: Payer: Self-pay | Admitting: Hematology

## 2020-01-26 ENCOUNTER — Ambulatory Visit: Payer: BC Managed Care – PPO | Admitting: Hematology

## 2020-01-26 ENCOUNTER — Other Ambulatory Visit: Payer: BC Managed Care – PPO

## 2020-01-26 NOTE — Telephone Encounter (Signed)
Rescheduled per 2/15 sch msg, pt req. Called and spoke with pt, confirmed 4/14 appt

## 2020-03-16 NOTE — Progress Notes (Incomplete)
?  [facility]   ?[phone] [phone]   ?Clinic Follow up Note  ? ?Patient Care Team: ?Georgianne Fick, MD as PCP - General (Internal Medicine) ?Osborn Coho, MD as Consulting Physician (Obstetrics and Gynecology) ? ?Date of Service:  03/16/2020 ? ?CHIEF COMPLAINT: Thrombocytopenia and Anemia  ? ?CURRENT THERAPY:  ?oral iron pill once daily  ? ?INTERVAL HISTORY: *** ?Heather Pena is here for a follow up Thrombocytopenia and Anemia. She was last seen by me in 07/2018. She presents to the clinic alone.  ? ? ? ?REVIEW OF SYSTEMS:  *** ?Constitutional: Denies fevers, chills or abnormal weight loss ?Eyes: Denies blurriness of vision ?Ears, nose, mouth, throat, and face: Denies mucositis or sore throat ?Respiratory: Denies cough, dyspnea or wheezes ?Cardiovascular: Denies palpitation, chest discomfort or lower extremity swelling ?Gastrointestinal:  Denies nausea, heartburn or change in bowel habits ?Skin: Denies abnormal skin rashes ?Lymphatics: Denies new lymphadenopathy or easy bruising ?Neurological:Denies numbness, tingling or new weaknesses ?Behavioral/Psych: Mood is stable, no new changes  ?All other systems were reviewed with the patient and are negative. ? ?MEDICAL HISTORY:  ?Past Medical History:  ?Diagnosis Date  ?? AMA (advanced maternal age) primigravida 35+   ?? Anemia   ?? Asthma   ?? Hashimoto's disease   ?? Headache   ?? Herpes   ?? History of endometriosis   ?? Hypothyroidism   ?? Newborn product of in vitro fertilization (IVF) pregnancy   ?? PONV (postoperative nausea and vomiting)   ? ? ?SURGICAL HISTORY: ?Past Surgical History:  ?Procedure Laterality Date  ?? ABDOMINAL HYSTERECTOMY    ?? CESAREAN SECTION N/A 01/24/2018  ? Procedure: CESAREAN SECTION;  Surgeon: Carrington Clamp, MD;  Location: [facility] BIRTHING SUITES;  Service: Obstetrics;  Laterality: N/A;  ?? CHROMOPERTUBATION N/A 04/20/2015  ?  Procedure: CHROMOPERTUBATION;  Surgeon: Osborn Coho, MD;  Location: [facility] OR[facility];  Service: Gynecology;  Laterality: N/A

## 2020-03-22 ENCOUNTER — Other Ambulatory Visit: Payer: Medicaid Other

## 2020-03-22 ENCOUNTER — Ambulatory Visit: Payer: Self-pay | Admitting: Hematology

## 2020-04-09 ENCOUNTER — Ambulatory Visit (INDEPENDENT_AMBULATORY_CARE_PROVIDER_SITE_OTHER)
Admission: RE | Admit: 2020-04-09 | Discharge: 2020-04-09 | Disposition: A | Discharge status: Home / Self Care | Payer: Medicaid Other | Source: Ambulatory Visit

## 2020-04-09 DIAGNOSIS — R519 Headache, unspecified: Secondary | ICD-10-CM

## 2020-04-09 MED ORDER — AMOXICILLIN-POT CLAVULANATE 875-125 MG PO TABS: 1.0000 | ORAL_TABLET | Freq: Two times a day (BID) | ORAL | 0 refills | Status: AC

## 2020-04-09 MED ORDER — IBUPROFEN 800 MG PO TABS: 800.0000 mg | ORAL_TABLET | Freq: Three times a day (TID) | ORAL | 0 refills | Status: AC

## 2020-04-09 NOTE — ED Provider Notes (Signed)
Virtual Visit via Video Note:  Heather Pena  initiated request for Telemedicine visit with [facility] Urgent [facility] team. I connected with Heather Pena  on 04/09/2020 at 7:07 PM  for a synchronized telemedicine visit using a video enabled HIPPA compliant telemedicine application. I verified that I am speaking with Heather Pena  using two identifiers. Hallie C Wieters, PA-C  was physically located in a [facility] Urgent care site and ELYN KROGH was located at a different location.   The limitations of evaluation and management by telemedicine as well as the availability of in-person appointments were discussed. Patient was informed that she  may incur a bill ( including co-pay) for this virtual visit encounter. Heather Pena  expressed understanding and gave verbal consent to proceed with virtual visit.     History of Present Illness:Heather Pena  is a 39 y.o. female presents for evaluation of headache.  Patient states that since 3/28 she has had recurrent headaches.  She initially thought symptoms were related from not eating much, but increasing oral intake did not help with headaches.  Headaches have woken her up out of sleep at times.  Headaches have waxed and waned and occasionally will improve with ibuprofen, but returned the following day.  She denies history of headaches/migraines prior to recently.  She notes that in the past 24 to 48 hours her headache has worsened and has had associated nausea vomiting, photophobia and straining of eyes.  Notes that headache is in frontal area as well as in the occipital area.  She reports poor oral intake of recently and feels really faint and lightheaded.  She had Covid testing yesterday which was negative.  She is also fully vaccinated.  3/28 really bad headache,, iitially thought from not eating much, didn't improve.  Denies associated congestion cough or sore throat.  She has also reported some slight pain and discomfort to  her right neck area with some swelling which feels similar to prior parotitis.    Allergies  Allergen Reactions  . Metronidazole Nausea And Vomiting    More vomiting.  . Sulfa Antibiotics Hives  . Sulfonamide Derivatives Hives     Past Medical History:  Diagnosis Date  . AMA (advanced maternal age) primigravida 80+   . Anemia   . Asthma   . Hashimoto's disease   . Headache   . Herpes   . History of endometriosis   . Hypothyroidism   . Newborn product of in vitro fertilization (IVF) pregnancy   . PONV (postoperative nausea and vomiting)      Social History   Tobacco Use  . Smoking status: Never Smoker  . Smokeless tobacco: Never Used  Substance Use Topics  . Alcohol use: Yes    Comment: social   . Drug use: No        Observations/Objective: Physical Exam  Constitutional: She is oriented to person, place, and time and well-developed, well-nourished, and in no distress. No distress.  HENT:  Head: Normocephalic and atraumatic.  Eyes:  Moving eyes appropriately  Pulmonary/Chest: Effort normal. No respiratory distress.  Speaking in full sentences  Neurological: She is alert and oriented to person, place, and time.  Speech clear, face symmetric  Psychiatric:  Thought process normal     Assessment and Plan:    ICD-10-CM   1. Acute nonintractable headache, unspecified headache type  R51.9     Discussed concerns with patient with new pattern of headaches as well as headaches waking  from sleep.  Recommended if headaches worsening to follow-up in emergency room for emergent imaging.  No gross neuro deficits noted off of video, did recommend that she may come in for trial of migraine cocktail.  Needs follow-up/work-up from PCP/neurology given new headaches.  Given location in bilateral frontal areas as well as recent symptoms similar parotiditis, opted to do trial of Augmentin to treat for sinusitis as well as parotiditis.   Discussed strict return precautions. Patient  verbalized understanding and is agreeable with plan.      Follow Up Instructions:     I discussed the assessment and treatment plan with the patient. The patient was provided an opportunity to ask questions and all were answered. The patient agreed with the plan and demonstrated an understanding of the instructions.   The patient was advised to call back or seek an in-person evaluation if the symptoms worsen or if the condition fails to improve as anticipated.      Lew Dawes, PA-C  04/09/2020 7:07 PM         Lew Dawes, PA-C 04/09/20 1909

## 2023-09-11 ENCOUNTER — Other Ambulatory Visit: Payer: No Typology Code available for payment source

## 2023-09-19 ENCOUNTER — Ambulatory Visit: Payer: No Typology Code available for payment source | Admitting: "Endocrinology

## 2023-11-04 ENCOUNTER — Other Ambulatory Visit: Payer: Self-pay

## 2023-11-04 DIAGNOSIS — E063 Autoimmune thyroiditis: Secondary | ICD-10-CM

## 2023-11-05 ENCOUNTER — Other Ambulatory Visit: Payer: No Typology Code available for payment source

## 2023-11-06 LAB — T4, FREE: Free T4: 1.2 ng/dL (ref 0.8–1.8)

## 2023-11-06 LAB — TSH: TSH: 1.55 m[IU]/L

## 2023-11-12 ENCOUNTER — Ambulatory Visit (INDEPENDENT_AMBULATORY_CARE_PROVIDER_SITE_OTHER): Payer: No Typology Code available for payment source | Admitting: "Endocrinology

## 2023-11-12 ENCOUNTER — Encounter: Payer: Self-pay | Admitting: "Endocrinology

## 2023-11-12 VITALS — BP 115/74 | HR 64 | Ht 63.0 in | Wt 123.0 lb

## 2023-11-12 DIAGNOSIS — Z8639 Personal history of other endocrine, nutritional and metabolic disease: Secondary | ICD-10-CM | POA: Diagnosis not present

## 2023-11-12 NOTE — Progress Notes (Signed)
Outpatient Endocrinology Note [place], MD  11/12/23   Heather Pena [birth year] [id number]  Referring Provider: Osborn Coho, MD Primary Care Provider: Georgianne Fick, MD Subjective  No chief complaint on file.   Assessment & Plan  Diagnoses and all orders for this visit:  History of Hashimoto thyroiditis -     TSH(Reflex)    Heather Pena is currently not taking any thyroid medication. Patient is currently biochemically euthyroid.  Educated on thyroid axis.  2017 thyroid ultrasound reported no thyroid nodules. Repeat lab before next visit or sooner if symptoms of hyperthyroidism or hypothyroidism develop.  Notify us immediately in case of pregnancy/breastfeeding or significant weight gain or loss. Counseled on compliance and follow up needs.  I have reviewed current medications, nurse's notes, allergies, vital signs, past medical and surgical history, family medical history, and social history for this encounter. Counseled patient on symptoms, examination findings, lab findings, imaging results, treatment decisions and monitoring and prognosis. The patient understood the recommendations and agrees with the treatment plan. All questions regarding treatment plan were fully answered.   Return in about 6 months (around 05/12/2024) for visit + labs before next visit.   Heather [place], MD  11/12/23   I have reviewed current medications, nurse's notes, allergies, vital signs, past medical and surgical history, family medical history, and social history for this encounter. Counseled patient on symptoms, examination findings, lab findings, imaging results, treatment decisions and monitoring and prognosis. The patient understood the recommendations and agrees with the treatment plan. All questions regarding treatment plan were fully answered.   History of Present Illness ANOUSHKA JUNK is a 42 y.o. year old female who presents to our clinic with  history of hashimoto's thyroiditis diagnosed in 2017.    Patient was diagnosed of hashimoto's thyroiditis around 2017 and started on low dose of synthroid around which was stopped after patient went on IVF treatment a year later.    Feels well. Has a 63 yo son from IVF.  Compressive symptoms:  dysphagia  No dysphonia  Yes, loses voice a lot  positional dyspnea (especially with simultaneous arms elevation)  No  Smokes  No On biotin  No Personal history of head/neck surgery/irradiation  No  2017 THYROID ULTRASOUND   TECHNIQUE: Ultrasound examination of the thyroid gland and adjacent soft tissues was performed.   COMPARISON:  None.   FINDINGS: Parenchymal Echotexture: Moderately heterogenous   Estimated total number of nodules >/= 1 cm: 0   Number of spongiform nodules >/=  2 cm not described below (TR1): 0   Number of mixed cystic and solid nodules >/= 1.5 cm not described below (TR2): 0   _________________________________________________________   Isthmus: 0.4 cm   No discrete nodules are identified within the thyroid isthmus.   _________________________________________________________   Right lobe: 5.1 x 1.2 x 1.9 cm   No discrete nodules are identified within the right lobe of the thyroid.   _________________________________________________________   Left lobe: 4.8 x 1.5 x 2.1 cm   No discrete nodules are identified within the left lobe of the thyroid.   IMPRESSION: Heterogeneous gland without focal nodule.  Physical Exam  BP 115/74   Pulse 64   Ht 5\' 3"  (1.6 m)   Wt 123 lb (55.8 kg)   SpO2 99%   BMI 21.79 kg/m  Constitutional: well developed, well nourished Head: normocephalic, atraumatic, no exophthalmos Eyes: sclera anicteric, no redness Neck: no thyromegaly, no thyroid tenderness; no nodules palpated Lungs: normal respiratory  effort Neurology: alert and oriented, no fine hand tremor Skin: dry, no appreciable rashes Musculoskeletal: no  appreciable defects Psychiatric: normal mood and affect  Allergies Allergies  Allergen Reactions   Metronidazole Nausea And Vomiting    More vomiting.   Sulfa Antibiotics Hives   Sulfonamide Derivatives Hives    Current Medications Patient's Medications  New Prescriptions   No medications on file  Previous Medications   ALBUTEROL (PROVENTIL HFA;VENTOLIN HFA) 108 (90 BASE) MCG/ACT INHALER    Inhale 1-2 puffs into the lungs every 6 (six) hours as needed for wheezing or shortness of breath.    IBUPROFEN (ADVIL) 800 MG TABLET    Take 1 tablet (800 mg total) by mouth 3 (three) times daily.   POLYETHYLENE GLYCOL (MIRALAX / GLYCOLAX) PACKET    Take 17 g by mouth daily.   PRENATAL VIT-FE FUMARATE-FA (PRENATAL MULTIVITAMIN) TABS TABLET    Take 1 tablet by mouth daily at 12 noon.   VALACYCLOVIR (VALTREX) 500 MG TABLET    Take 500 mg by mouth daily.  Modified Medications   No medications on file  Discontinued Medications   No medications on file    Past Medical History Past Medical History:  Diagnosis Date   AMA (advanced maternal age) primigravida 35+    Anemia    Asthma    Hashimoto's disease    Headache    Herpes    History of endometriosis    Hypothyroidism    Newborn product of in vitro fertilization (IVF) pregnancy    PONV (postoperative nausea and vomiting)     Past Surgical History Past Surgical History:  Procedure Laterality Date   ABDOMINAL HYSTERECTOMY     CESAREAN SECTION N/A 01/24/2018   Procedure: CESAREAN SECTION;  Surgeon: Carrington Clamp, MD;  Location: [facility]IRTHING SUIT[facility];  Service: Obstetrics;  Laterality: N/A;   CHROMOPERTUBATION N/A 04/20/2015   Procedure: CHROMOPERTUBATION;  Surgeon: Osborn Coho, MD;  Location: [facility];  Service: Gynecology;  Laterality: N/A;   COLONOSCOPY  2012   DIAGNOSTIC LAPAROSCOPY     2012, and 2009   DILATATION & CURRETTAGE/HYSTEROSCOPY WITH RESECTOCOPE  04/20/2015   Procedure: DILATATION & CURETTAGE/HYSTEROSCOPY WITH  RESECTOCOPE;  Surgeon: Osborn Coho, MD;  Location: [facility];  Service: Gynecology;;   ENDOMETRIAL ABLATION  2009   Dr Seymour Bars: Da Vinci-assisted lysis of adhesions for conservative   FINGER FRACTURE SURGERY  2004   HERNIA REPAIR     LAPAROSCOPY N/A 04/20/2015   Procedure: LAPAROSCOPY OPERATIVE;  Surgeon: Osborn Coho, MD;  Location: [facility];  Service: Gynecology;  Laterality: N/A;   WISDOM TOOTH EXTRACTION Bilateral     Family History family history includes Birth defects in her cousin; Hypertension in her maternal grandfather.  Social History Social History   Socioeconomic History   Marital status: Married    Spouse name: Not on file   Number of children: Not on file   Years of education: Not on file   Highest education level: Not on file  Occupational History   Not on file  Tobacco Use   Smoking status: Never   Smokeless tobacco: Never  Substance and Sexual Activity   Alcohol use: Yes    Comment: social    Drug use: No   Sexual activity: Not Currently  Other Topics Concern   Not on file  Social History Narrative   Not on file   Social Determinants of Health   Financial Resource Strain: Not on file  Food Insecurity: Not on file  Transportation Needs: Not  on file  Physical Activity: Not on file  Stress: Not on file  Social Connections: Not on file  Intimate Partner Violence: Not on file    Laboratory Investigations Lab Results  Component Value Date   TSH 1.55 11/05/2023   TSH 1.125 01/24/2018   TSH 0.66 03/09/2010   FREET4 1.2 11/05/2023   FREET4 0.82 01/24/2018     No results found for: "TSI"   No components found for: "TRAB"   No results found for: "CHOL" No results found for: "HDL" No results found for: "LDLCALC" Lab Results  Component Value Date   TRIG 152 (H) 01/24/2018   No results found for: "CHOLHDL" Lab Results  Component Value Date   CREATININE 0.71 08/25/2018   No results found for: "GFR"    Component Value Date/Time   NA 145  08/25/2018 2249   K 3.7 08/25/2018 2249   CL 112 (H) 08/25/2018 2249   CO2 26 08/25/2018 2249   GLUCOSE 86 08/25/2018 2249   BUN 14 08/25/2018 2249   CREATININE 0.71 08/25/2018 2249   CALCIUM 8.9 08/25/2018 2249   PROT 5.1 (L) 01/27/2018 1637   ALBUMIN 2.6 (L) 01/27/2018 1637   AST 24 01/27/2018 1637   ALT 17 01/27/2018 1637   ALKPHOS 59 01/27/2018 1637   BILITOT 1.0 01/27/2018 1637   GFRNONAA >60 08/25/2018 2249   GFRAA >60 08/25/2018 2249      Latest Ref Rng & Units 08/25/2018   10:49 PM 01/27/2018    4:37 PM 01/27/2018    2:42 AM  BMP  Glucose 70 - 99 mg/dL 86  78  84   BUN 6 - 20 mg/dL 14  5  <5   Creatinine 0.44 - 1.00 mg/dL 4.09  8.11  9.14   Sodium 135 - 145 mmol/L 145  134  137   Potassium 3.5 - 5.1 mmol/L 3.7  3.6  3.5   Chloride 98 - 111 mmol/L 112  101  104   CO2 22 - 32 mmol/L 26  24  24    Calcium 8.9 - 10.3 mg/dL 8.9  7.9  8.2        Component Value Date/Time   WBC 4.9 08/25/2018 2249   RBC 3.97 08/25/2018 2249   HGB 11.1 (L) 08/25/2018 2249   HGB 12.7 07/08/2018 1032   HCT 34.5 (L) 08/25/2018 2249   PLT 266 08/25/2018 2249   PLT 244 07/08/2018 1032   MCV 86.9 08/25/2018 2249   MCH 28.0 08/25/2018 2249   MCHC 32.2 08/25/2018 2249   RDW 13.2 08/25/2018 2249   LYMPHSABS 1.2 07/08/2018 1032   MONOABS 0.4 07/08/2018 1032   EOSABS 0.2 07/08/2018 1032   BASOSABS 0.0 07/08/2018 1032      Parts of this note may have been dictated using voice recognition software. There may be variances in spelling and vocabulary which are unintentional. Not all errors are proofread. Please notify the Thereasa Parkin if any discrepancies are noted or if the meaning of any statement is not clear.

## 2024-04-06 ENCOUNTER — Other Ambulatory Visit: Payer: Self-pay | Admitting: Family Medicine

## 2024-04-06 ENCOUNTER — Encounter: Payer: Self-pay | Admitting: Internal Medicine

## 2024-04-06 DIAGNOSIS — N6320 Unspecified lump in the left breast, unspecified quadrant: Secondary | ICD-10-CM

## 2024-04-06 DIAGNOSIS — Z1231 Encounter for screening mammogram for malignant neoplasm of breast: Secondary | ICD-10-CM

## 2024-04-14 ENCOUNTER — Other Ambulatory Visit: Payer: Self-pay

## 2024-04-20 ENCOUNTER — Ambulatory Visit
Admission: RE | Admit: 2024-04-20 | Discharge: 2024-04-20 | Disposition: A | Discharge status: Home / Self Care | Source: Ambulatory Visit | Attending: Family Medicine | Admitting: Family Medicine

## 2024-04-20 ENCOUNTER — Other Ambulatory Visit: Payer: Self-pay | Admitting: Family Medicine

## 2024-04-20 DIAGNOSIS — N6320 Unspecified lump in the left breast, unspecified quadrant: Secondary | ICD-10-CM

## 2024-04-21 ENCOUNTER — Other Ambulatory Visit: Payer: Self-pay | Admitting: Family Medicine

## 2024-04-21 DIAGNOSIS — R590 Localized enlarged lymph nodes: Secondary | ICD-10-CM

## 2024-05-07 ENCOUNTER — Other Ambulatory Visit: Payer: No Typology Code available for payment source

## 2024-05-12 ENCOUNTER — Ambulatory Visit: Payer: No Typology Code available for payment source | Admitting: "Endocrinology

## 2024-07-22 ENCOUNTER — Other Ambulatory Visit

## 2024-07-22 ENCOUNTER — Ambulatory Visit
Admission: RE | Admit: 2024-07-22 | Discharge: 2024-07-22 | Disposition: A | Discharge status: Home / Self Care | Source: Ambulatory Visit | Attending: Family Medicine | Admitting: Family Medicine

## 2024-07-22 ENCOUNTER — Other Ambulatory Visit: Payer: Self-pay | Admitting: Family Medicine

## 2024-07-22 DIAGNOSIS — R590 Localized enlarged lymph nodes: Secondary | ICD-10-CM

## 2024-07-24 ENCOUNTER — Other Ambulatory Visit: Payer: Self-pay | Admitting: Family Medicine

## 2024-07-24 DIAGNOSIS — R599 Enlarged lymph nodes, unspecified: Secondary | ICD-10-CM

## 2024-08-25 ENCOUNTER — Other Ambulatory Visit: Payer: Self-pay | Admitting: Family Medicine

## 2024-08-25 ENCOUNTER — Ambulatory Visit
Admission: RE | Admit: 2024-08-25 | Discharge: 2024-08-25 | Disposition: A | Discharge status: Home / Self Care | Source: Ambulatory Visit | Attending: Family Medicine | Admitting: Family Medicine

## 2024-08-25 DIAGNOSIS — R0602 Shortness of breath: Secondary | ICD-10-CM

## 2025-01-25 ENCOUNTER — Encounter

## 2025-01-25 ENCOUNTER — Other Ambulatory Visit
